# Patient Record
Sex: Male | Born: 1955
Health system: Southern US, Community
[De-identification: ages and names within clinical notes are randomized; demographics above are authoritative.]

---

## 1996-11-26 HISTORY — PX: CHOLECYSTECTOMY: SHX55

## 2006-11-26 HISTORY — PX: HERNIA REPAIR: SHX51

## 2011-02-19 DIAGNOSIS — Z85828 Personal history of other malignant neoplasm of skin: Secondary | ICD-10-CM | POA: Insufficient documentation

## 2012-12-10 DIAGNOSIS — H903 Sensorineural hearing loss, bilateral: Secondary | ICD-10-CM | POA: Insufficient documentation

## 2012-12-10 DIAGNOSIS — H908 Mixed conductive and sensorineural hearing loss, unspecified: Secondary | ICD-10-CM | POA: Insufficient documentation

## 2014-05-03 DIAGNOSIS — N529 Male erectile dysfunction, unspecified: Secondary | ICD-10-CM | POA: Insufficient documentation

## 2015-06-02 DIAGNOSIS — R001 Bradycardia, unspecified: Secondary | ICD-10-CM | POA: Insufficient documentation

## 2016-06-25 ENCOUNTER — Encounter: Payer: Self-pay | Admitting: Family Medicine

## 2017-05-02 LAB — HEPATIC FUNCTION PANEL: Bilirubin, Total: 0.5

## 2017-05-02 LAB — BASIC METABOLIC PANEL
BUN: 14 (ref 4–21)
Creatinine: 1.2 (ref 0.6–1.3)
Glucose: 86
Sodium: 141 (ref 137–147)

## 2017-05-02 LAB — PSA: PSA: 2.9

## 2017-05-02 LAB — TSH: TSH: 1.86 (ref <=5.90)

## 2019-07-13 ENCOUNTER — Ambulatory Visit (INDEPENDENT_AMBULATORY_CARE_PROVIDER_SITE_OTHER): Admitting: Family Medicine

## 2019-07-13 ENCOUNTER — Other Ambulatory Visit: Payer: Self-pay

## 2019-07-13 ENCOUNTER — Encounter: Payer: Self-pay | Admitting: Family Medicine

## 2019-07-13 VITALS — BP 148/84 | HR 67 | Temp 98.5°F | Ht 72.0 in | Wt 177.2 lb

## 2019-07-13 DIAGNOSIS — Z125 Encounter for screening for malignant neoplasm of prostate: Secondary | ICD-10-CM

## 2019-07-13 DIAGNOSIS — Z0000 Encounter for general adult medical examination without abnormal findings: Secondary | ICD-10-CM

## 2019-07-13 DIAGNOSIS — Z1159 Encounter for screening for other viral diseases: Secondary | ICD-10-CM

## 2019-07-13 DIAGNOSIS — I10 Essential (primary) hypertension: Secondary | ICD-10-CM | POA: Diagnosis not present

## 2019-07-13 DIAGNOSIS — Z114 Encounter for screening for human immunodeficiency virus [HIV]: Secondary | ICD-10-CM

## 2019-07-13 DIAGNOSIS — C44519 Basal cell carcinoma of skin of other part of trunk: Secondary | ICD-10-CM | POA: Insufficient documentation

## 2019-07-13 HISTORY — DX: Essential (primary) hypertension: I10

## 2019-07-13 LAB — LIPID PANEL
Cholesterol: 162 mg/dL (ref 0–200)
HDL: 36.5 mg/dL — ABNORMAL LOW (ref >39.00)
LDL Cholesterol: 86 mg/dL (ref 0–99)
NonHDL: 125.54
Total CHOL/HDL Ratio: 4
Triglycerides: 196 mg/dL — ABNORMAL HIGH (ref 0.0–149.0)
VLDL: 39.2 mg/dL (ref 0.0–40.0)

## 2019-07-13 LAB — COMPREHENSIVE METABOLIC PANEL
ALT: 18 U/L (ref 0–53)
AST: 16 U/L (ref 0–37)
Albumin: 4.5 g/dL (ref 3.5–5.2)
Alkaline Phosphatase: 82 U/L (ref 39–117)
BUN: 11 mg/dL (ref 6–23)
CO2: 30 mEq/L (ref 19–32)
Calcium: 9.6 mg/dL (ref 8.4–10.5)
Chloride: 103 mEq/L (ref 96–112)
Creatinine, Ser: 1.05 mg/dL (ref 0.40–1.50)
GFR: 71.06 mL/min (ref >60.00)
Glucose, Bld: 82 mg/dL (ref 70–99)
Potassium: 4.3 mEq/L (ref 3.5–5.1)
Sodium: 142 mEq/L (ref 135–145)
Total Bilirubin: 0.6 mg/dL (ref 0.2–1.2)
Total Protein: 6.8 g/dL (ref 6.0–8.3)

## 2019-07-13 LAB — CBC WITH DIFFERENTIAL/PLATELET
Basophils Absolute: 0 10*3/uL (ref 0.0–0.1)
Basophils Relative: 0.6 % (ref 0.0–3.0)
Eosinophils Absolute: 0.1 10*3/uL (ref 0.0–0.7)
Eosinophils Relative: 1.4 % (ref 0.0–5.0)
HCT: 45.2 % (ref 39.0–52.0)
Hemoglobin: 15.6 g/dL (ref 13.0–17.0)
Lymphocytes Relative: 29.7 % (ref 12.0–46.0)
Lymphs Abs: 1.7 10*3/uL (ref 0.7–4.0)
MCHC: 34.5 g/dL (ref 30.0–36.0)
MCV: 93.7 fl (ref 78.0–100.0)
Monocytes Absolute: 0.5 10*3/uL (ref 0.1–1.0)
Monocytes Relative: 8.3 % (ref 3.0–12.0)
Neutro Abs: 3.4 10*3/uL (ref 1.4–7.7)
Neutrophils Relative %: 60 % (ref 43.0–77.0)
Platelets: 210 10*3/uL (ref 150.0–400.0)
RBC: 4.82 Mil/uL (ref 4.22–5.81)
RDW: 12.6 % (ref 11.5–15.5)
WBC: 5.7 10*3/uL (ref 4.0–10.5)

## 2019-07-13 LAB — PSA: PSA: 3.94 ng/mL (ref 0.10–4.00)

## 2019-07-13 LAB — MICROALBUMIN / CREATININE URINE RATIO
Creatinine,U: 141.5 mg/dL
Microalb Creat Ratio: 0.6 mg/g (ref 0.0–30.0)
Microalb, Ur: 0.8 mg/dL (ref 0.0–1.9)

## 2019-07-13 LAB — TSH: TSH: 2.56 u[IU]/mL (ref 0.35–4.50)

## 2019-07-13 NOTE — Patient Instructions (Signed)
Goal blood pressure is 140/80 or less. I need to see if you running over this.     Preventive Care 27-64 Years Old, Male Preventive care refers to lifestyle choices and visits with your health care provider that can promote health and wellness. This includes:  A yearly physical exam. This is also called an annual well check.  Regular dental and eye exams.  Immunizations.  Screening for certain conditions.  Healthy lifestyle choices, such as eating a healthy diet, getting regular exercise, not using drugs or products that contain nicotine and tobacco, and limiting alcohol use. What can I expect for my preventive care visit? Physical exam Your health care provider will check:  Height and weight. These may be used to calculate body mass index (BMI), which is a measurement that tells if you are at a healthy weight.  Heart rate and blood pressure.  Your skin for abnormal spots. Counseling Your health care provider may ask you questions about:  Alcohol, tobacco, and drug use.  Emotional well-being.  Home and relationship well-being.  Sexual activity.  Eating habits.  Work and work Statistician. What immunizations do I need?  Influenza (flu) vaccine  This is recommended every year. Tetanus, diphtheria, and pertussis (Tdap) vaccine  You may need a Td booster every 10 years. Varicella (chickenpox) vaccine  You may need this vaccine if you have not already been vaccinated. Zoster (shingles) vaccine  You may need this after age 20. Measles, mumps, and rubella (MMR) vaccine  You may need at least one dose of MMR if you were born in 1957 or later. You may also need a second dose. Pneumococcal conjugate (PCV13) vaccine  You may need this if you have certain conditions and were not previously vaccinated. Pneumococcal polysaccharide (PPSV23) vaccine  You may need one or two doses if you smoke cigarettes or if you have certain conditions. Meningococcal conjugate (MenACWY)  vaccine  You may need this if you have certain conditions. Hepatitis A vaccine  You may need this if you have certain conditions or if you travel or work in places where you may be exposed to hepatitis A. Hepatitis B vaccine  You may need this if you have certain conditions or if you travel or work in places where you may be exposed to hepatitis B. Haemophilus influenzae type b (Hib) vaccine  You may need this if you have certain risk factors. Human papillomavirus (HPV) vaccine  If recommended by your health care provider, you may need three doses over 6 months. You may receive vaccines as individual doses or as more than one vaccine together in one shot (combination vaccines). Talk with your health care provider about the risks and benefits of combination vaccines. What tests do I need? Blood tests  Lipid and cholesterol levels. These may be checked every 5 years, or more frequently if you are over 15 years old.  Hepatitis C test.  Hepatitis B test. Screening  Lung cancer screening. You may have this screening every year starting at age 82 if you have a 30-pack-year history of smoking and currently smoke or have quit within the past 15 years.  Prostate cancer screening. Recommendations will vary depending on your family history and other risks.  Colorectal cancer screening. All adults should have this screening starting at age 70 and continuing until age 68. Your health care provider may recommend screening at age 86 if you are at increased risk. You will have tests every 1-10 years, depending on your results and the type  of screening test.  Diabetes screening. This is done by checking your blood sugar (glucose) after you have not eaten for a while (fasting). You may have this done every 1-3 years.  Sexually transmitted disease (STD) testing. Follow these instructions at home: Eating and drinking  Eat a diet that includes fresh fruits and vegetables, whole grains, lean protein,  and low-fat dairy products.  Take vitamin and mineral supplements as recommended by your health care provider.  Do not drink alcohol if your health care provider tells you not to drink.  If you drink alcohol: ? Limit how much you have to 0-2 drinks a day. ? Be aware of how much alcohol is in your drink. In the [place] one drink equals one 12 oz bottle of beer (355 mL), one 5 oz glass of wine (148 mL), or one 1 oz glass of hard liquor (44 mL). Lifestyle  Take daily care of your teeth and gums.  Stay active. Exercise for at least 30 minutes on 5 or more days each week.  Do not use any products that contain nicotine or tobacco, such as cigarettes, e-cigarettes, and chewing tobacco. If you need help quitting, ask your health care provider.  If you are sexually active, practice safe sex. Use a condom or other form of protection to prevent STIs (sexually transmitted infections).  Talk with your health care provider about taking a low-dose aspirin every day starting at age 66. What's next?  Go to your health care provider once a year for a well check visit.  Ask your health care provider how often you should have your eyes and teeth checked.  Stay up to date on all vaccines. This information is not intended to replace advice given to you by your health care provider. Make sure you discuss any questions you have with your health care provider. Document Released: 12/09/2015 Document Revised: 11/06/2018 Document Reviewed: 11/06/2018 Elsevier Patient Education  2020 [employer].

## 2019-07-13 NOTE — Progress Notes (Signed)
Patient: Barry Tanner MRN: [id number] DOB: [birth year] PCP: Orma Flaming, MD     Subjective:  Chief Complaint  Patient presents with  . Establish Care    HPI: The patient is a 63 y.o. male who presents today for annual exam. He denies any changes to past medical history. There have been no recent hospitalizations. They are following a well balanced diet and mild exercise plan. Weight has been stable. No complaints today.   Family hx of diabetes/cva in mother. Paternal hx of aneurysm and possible cancer.   Hypertension: Here for follow up of hypertension.  Currently on lisinopril/hctz . Home readings range from 875-643 PIRJJOAC/16 diastolic. Takes medication as prescribed and denies any side effects. Exercise includes walking . Weight has been stable. Denies any chest pain, headaches, shortness of breath, vision changes, swelling in lower extremities.    There is no immunization history on file for this patient.   Colonoscopy: less than 2 years ago. Repeat in 10 years.  PSA: today  Tdap: 2016  Review of Systems  Constitutional: Negative for chills, fatigue and fever.  HENT: Positive for sore throat. Negative for congestion, ear pain, postnasal drip, rhinorrhea, sinus pressure and sinus pain.        Feels that this is attributed to not being hydrated enough  Eyes: Negative for visual disturbance.  Respiratory: Negative for cough and shortness of breath.   Cardiovascular: Negative.   Gastrointestinal: Negative for abdominal pain, diarrhea, nausea and vomiting.  Endocrine: Negative for polydipsia and polyuria.  Genitourinary: Negative.   Musculoskeletal: Negative.   Skin: Negative.  Negative for rash.  Neurological: Negative.   Psychiatric/Behavioral: Negative for sleep disturbance. The patient is not nervous/anxious.     Allergies Patient has No Known Allergies.  Past Medical History Patient  has no past medical history on file.  Surgical History Patient  has a past  surgical history that includes Hernia repair (2008) and Cholecystectomy (1998).  Family History Pateint's family history is not on file.  Social History Patient  reports that he has quit smoking. He has quit using smokeless tobacco. He reports current alcohol use. He reports that he does not use drugs.    Objective: Vitals:   07/13/19 0817 07/13/19 0848  BP: 138/90 (!) 148/84  Pulse: 67   Temp: 98.5 F (36.9 C)   TempSrc: Temporal   SpO2: 99%   Weight: 177 lb 3.2 oz (80.4 kg)   Height: 6' (1.829 m)     Body mass index is 24.03 kg/m.  Physical Exam Vitals signs reviewed.  Constitutional:      Appearance: Normal appearance. He is well-developed and normal weight.  HENT:     Head: Normocephalic and atraumatic.     Right Ear: External ear normal. There is impacted cerumen.     Left Ear: External ear normal. There is impacted cerumen.     Nose: Nose normal.     Mouth/Throat:     Mouth: Mucous membranes are moist.  Eyes:     Extraocular Movements: Extraocular movements intact.     Conjunctiva/sclera: Conjunctivae normal.     Pupils: Pupils are equal, round, and reactive to light.  Neck:     Musculoskeletal: Normal range of motion and neck supple.     Thyroid: No thyromegaly.     Vascular: No carotid bruit.     Comments: No thyromegaly.  Cardiovascular:     Rate and Rhythm: Normal rate and regular rhythm.     Pulses: Normal pulses.  Heart sounds: Normal heart sounds. No murmur.  Pulmonary:     Effort: Pulmonary effort is normal.     Breath sounds: Normal breath sounds.  Abdominal:     General: Abdomen is flat. Bowel sounds are normal. There is no distension.     Palpations: Abdomen is soft.     Tenderness: There is no abdominal tenderness.  Lymphadenopathy:     Cervical: No cervical adenopathy.  Skin:    General: Skin is warm and dry.     Capillary Refill: Capillary refill takes less than 2 seconds.     Findings: No rash.  Neurological:     General: No focal  deficit present.     Mental Status: He is alert and oriented to person, place, and time.     Cranial Nerves: No cranial nerve deficit.     Coordination: Coordination normal.     Deep Tendon Reflexes: Reflexes normal.  Psychiatric:        Mood and Affect: Mood normal.        Behavior: Behavior normal.        Depression screen [facility] 2/9 07/13/2019  Decreased Interest 0  Down, Depressed, Hopeless 0  PHQ - 2 Score 0    Assessment/plan: 1. Benign essential HTN Blood pressure is close to goal. Continue current anti-hypertensive medications. Refills not given and routine lab work will be done today. Recommended routine exercise and healthy diet including DASH diet and mediterranean diet. Encouraged weight loss. F/u in 6-12 months. Continue with log. Discussed his home readings are to goal, but elevated here. Goal for him is <140/80. Risks of uncontrolled htn discussed.   - CBC with Differential/Platelet - Comprehensive metabolic panel - Lipid panel - Microalbumin / creatinine urine ratio  2. Annual physical exam utd on HM. Brought records except for immunizations. Requested records be sent over. Routine labs today. Increase exercise, but overall doing well. F/u in one year or as needed.  Patient counseling [x]    Nutrition: Stressed importance of moderation in sodium/caffeine intake, saturated fat and cholesterol, caloric balance, sufficient intake of fresh fruits, vegetables, fiber, calcium, iron, and 1 mg of folate supplement per day (for females capable of pregnancy).  [x]    Stressed the importance of regular exercise.   []    Substance Abuse: Discussed cessation/primary prevention of tobacco, alcohol, or other drug use; driving or other dangerous activities under the influence; availability of treatment for abuse.   [x]    Injury prevention: Discussed safety belts, safety helmets, smoke detector, smoking near bedding or upholstery.   [x]    Sexuality: Discussed sexually transmitted diseases,  partner selection, use of condoms, avoidance of unintended pregnancy  and contraceptive alternatives.  [x]    Dental health: Discussed importance of regular tooth brushing, flossing, and dental visits.  [x]    Health maintenance and immunizations reviewed. Please refer to Health maintenance section.    - TSH  3. Screening for prostate cancer  - PSA  4. Encounter for screening for HIV  - HIV Antibody (routine testing w rflx)  5. Encounter for hepatitis C screening test for low risk patient  - Hepatitis C antibody  Return in about 1 year (around 07/12/2020) for annual/htn .   Orma Flaming, MD [place]  07/13/2019

## 2019-07-15 ENCOUNTER — Encounter: Payer: Self-pay | Admitting: Family Medicine

## 2019-07-15 DIAGNOSIS — E785 Hyperlipidemia, unspecified: Secondary | ICD-10-CM | POA: Insufficient documentation

## 2019-07-15 DIAGNOSIS — E782 Mixed hyperlipidemia: Secondary | ICD-10-CM

## 2019-07-15 HISTORY — DX: Mixed hyperlipidemia: E78.2

## 2019-07-15 LAB — HEPATITIS C ANTIBODY
Hepatitis C Ab: NONREACTIVE
SIGNAL TO CUT-OFF: 0.01 (ref <1.00)

## 2019-07-15 LAB — HIV ANTIBODY (ROUTINE TESTING W REFLEX): HIV 1&2 Ab, 4th Generation: NONREACTIVE

## 2019-07-29 ENCOUNTER — Other Ambulatory Visit: Payer: Self-pay | Admitting: Family Medicine

## 2019-07-29 DIAGNOSIS — E782 Mixed hyperlipidemia: Secondary | ICD-10-CM

## 2019-07-29 MED ORDER — ROSUVASTATIN CALCIUM 5 MG PO TABS: 5.0000 mg | ORAL_TABLET | Freq: Every day | ORAL | 3 refills | Status: DC

## 2019-08-06 ENCOUNTER — Encounter: Payer: Self-pay | Admitting: Family Medicine

## 2019-08-07 ENCOUNTER — Encounter: Payer: Self-pay | Admitting: Family Medicine

## 2019-08-07 DIAGNOSIS — H53001 Unspecified amblyopia, right eye: Secondary | ICD-10-CM | POA: Insufficient documentation

## 2019-11-18 ENCOUNTER — Encounter: Payer: Self-pay | Admitting: Family Medicine

## 2020-01-20 ENCOUNTER — Encounter: Payer: Self-pay | Admitting: Family Medicine

## 2020-01-21 ENCOUNTER — Other Ambulatory Visit: Payer: Self-pay

## 2020-01-21 ENCOUNTER — Other Ambulatory Visit: Payer: Self-pay | Admitting: Family Medicine

## 2020-01-21 MED ORDER — LISINOPRIL 10 MG PO TABS: 10.0000 mg | ORAL_TABLET | Freq: Two times a day (BID) | ORAL | 0 refills | Status: DC

## 2020-01-21 NOTE — Progress Notes (Signed)
Rx refill for Lisinopril, sent to pharmacy.

## 2020-02-17 ENCOUNTER — Encounter: Payer: Self-pay | Admitting: Family Medicine

## 2020-04-12 ENCOUNTER — Telehealth: Payer: Self-pay

## 2020-04-12 NOTE — Telephone Encounter (Signed)
OK for audiologist referral?

## 2020-04-12 NOTE — Telephone Encounter (Signed)
Patient states that he need hearing aids. Patient would like to have a hearing test completed. Patient states that he had a hearing test done over 2 years ago. Patient would like to know would Dr. Rogers Blocker could complete this test or could she referral him over to a specialist.

## 2020-04-13 ENCOUNTER — Other Ambulatory Visit: Payer: Self-pay | Admitting: Family Medicine

## 2020-04-13 DIAGNOSIS — H9113 Presbycusis, bilateral: Secondary | ICD-10-CM

## 2020-04-13 NOTE — Telephone Encounter (Signed)
Pt notified, and he verbalizes understanding.

## 2020-04-13 NOTE — Telephone Encounter (Signed)
Please let him know he will have to go see an audiologist. I put in referral for this for him. Let us know if he doesn't hear from the referral department!  Dr. Rogers Blocker

## 2020-04-19 ENCOUNTER — Encounter: Payer: Self-pay | Admitting: Family Medicine

## 2020-05-02 ENCOUNTER — Other Ambulatory Visit: Payer: Self-pay

## 2020-05-02 ENCOUNTER — Encounter: Payer: Self-pay | Admitting: Family Medicine

## 2020-05-02 MED ORDER — HYDROCHLOROTHIAZIDE 12.5 MG PO CAPS: 12.5000 mg | ORAL_CAPSULE | Freq: Every day | ORAL | 0 refills | Status: DC

## 2020-05-04 ENCOUNTER — Encounter: Payer: Self-pay | Admitting: Family Medicine

## 2020-05-04 ENCOUNTER — Other Ambulatory Visit: Payer: Self-pay

## 2020-05-04 DIAGNOSIS — H9113 Presbycusis, bilateral: Secondary | ICD-10-CM

## 2020-05-04 NOTE — Progress Notes (Signed)
NT

## 2020-05-17 ENCOUNTER — Encounter: Payer: Self-pay | Admitting: Family Medicine

## 2020-05-23 ENCOUNTER — Telehealth: Payer: Self-pay | Admitting: Family Medicine

## 2020-05-23 NOTE — Telephone Encounter (Signed)
Dr. Pollie Friar office called stating that they are not in network with [facility].  They are requesting patient to be referred to a different office.

## 2020-05-24 ENCOUNTER — Telehealth (INDEPENDENT_AMBULATORY_CARE_PROVIDER_SITE_OTHER): Payer: Medicare Other | Admitting: Family Medicine

## 2020-05-24 ENCOUNTER — Encounter: Payer: Self-pay | Admitting: Family Medicine

## 2020-05-24 DIAGNOSIS — R21 Rash and other nonspecific skin eruption: Secondary | ICD-10-CM | POA: Diagnosis not present

## 2020-05-24 MED ORDER — TRIAMCINOLONE ACETONIDE 0.1 % EX CREA: 1.0000 "application " | TOPICAL_CREAM | Freq: Two times a day (BID) | CUTANEOUS | 0 refills | Status: DC

## 2020-05-24 NOTE — Progress Notes (Signed)
Virtual Visit via Video Note  I connected with Barry Tanner  on 05/24/20 at 12:00 PM EDT by a video enabled telemedicine application and verified that I am speaking with the correct person using two identifiers.  Location patient: home, [place] Location provider:work or home office Persons participating in the virtual visit: patient, provider  I discussed the limitations of evaluation and management by telemedicine and the availability of in person appointments. The patient expressed understanding and agreed to proceed.   HPI:  Acute visit for Rash: -started about 4 days ago -symptoms: itchy rash on the R arm and a little on the L wrist, L ankle, very itchy, vesicular -reports has been outside quite a bit, has dog that goes in and out, has been doing some yard work -denies lesion on or around mucus membranes, breathing issue, purulent discharge, malaise -BP 110/76, HR 65, temp 97.8  ROS: See pertinent positives and negatives per HPI.  History reviewed. No pertinent past medical history.  Past Surgical History:  Procedure Laterality Date  . CHOLECYSTECTOMY  1998  . HERNIA REPAIR  2008    Family History  Problem Relation Age of Onset  . Stroke Mother   . Hypertension Mother   . Diabetes Mother   . Hearing loss Father     SOCIAL HX: see hpi   Current Outpatient Medications:  .  Ascorbic Acid (VITAMIN C) 1000 MG tablet, Take 1,000 mg by mouth daily., Disp: , Rfl:  .  hydrochlorothiazide (MICROZIDE) 12.5 MG capsule, Take 1 capsule (12.5 mg total) by mouth daily., Disp: 90 capsule, Rfl: 0 .  lisinopril (ZESTRIL) 10 MG tablet, TAKE 1 TABLET(10 MG) BY MOUTH TWICE DAILY, Disp: 180 tablet, Rfl: 1 .  Multiple Vitamin (MULTIVITAMIN ADULT PO), Take by mouth daily., Disp: , Rfl:  .  Omega-3 Fatty Acids (FISH OIL) 1000 MG CAPS, Take 2,000 mg by mouth 2 (two) times daily., Disp: , Rfl:  .  rosuvastatin (CRESTOR) 5 MG tablet, Take 1 tablet (5 mg total) by mouth daily., Disp: 90 tablet, Rfl: 3 .   triamcinolone cream (KENALOG) 0.1 %, Apply 1 application topically 2 (two) times daily., Disp: 45 g, Rfl: 0  EXAM:  VITALS per patient if applicable:  GENERAL: alert, oriented, appears well and in no acute distress  HEENT: atraumatic, conjunttiva clear, no obvious abnormalities on inspection of external nose and ears  NECK: normal movements of the head and neck  LUNGS: on inspection no signs of respiratory distress, breathing rate appears normal, no obvious gross SOB, gasping or wheezing  CV: no obvious cyanosis  SKIN: on video visit exam scattered plaques of erythematous papulovesicular rash on the R forearm and arm, small similar patch on the L wrist and L ankle  MS: moves all visible extremities without noticeable abnormality  PSYCH/NEURO: pleasant and cooperative, no obvious depression or anxiety, speech and thought processing grossly intact  ASSESSMENT AND PLAN:  Discussed the following assessment and plan:  Rash  -we discussed possible serious and likely etiologies, options for evaluation and workup, limitations of telemedicine visit vs in person visit, treatment, treatment risks and precautions. Pt prefers to treat via telemedicine empirically rather then risking or undertaking an in person visit at this moment. Suspect toxicodendron dermatitis vs other. Opted for topical triam cr 0.1% bid x 2 weeks, caution in creases, antihistamine OTC, gentle skin care and patient agrees to seek prompt in person care if worsening, new symptoms arise, spreading or lesions on the face or if is not improving with treatment.  I discussed the assessment and treatment plan with the patient. The patient was provided an opportunity to ask questions and all were answered. The patient agreed with the plan and demonstrated an understanding of the instructions.   The patient was advised to call back or seek an in-person evaluation if the symptoms worsen or if the condition fails to improve as  anticipated.   Lucretia Kern, DO

## 2020-05-25 ENCOUNTER — Telehealth: Admitting: Physician Assistant

## 2020-07-11 ENCOUNTER — Encounter: Payer: Self-pay | Admitting: Family Medicine

## 2020-07-11 NOTE — Telephone Encounter (Signed)
Please r/s CPE for Virtual Visit.   Thank You

## 2020-07-11 NOTE — Telephone Encounter (Signed)
Patient returned melitta's call, states he went and got tested and should receive results in 3-5 business days and has rescheduled his CPE.

## 2020-07-12 ENCOUNTER — Other Ambulatory Visit: Payer: Self-pay

## 2020-07-12 ENCOUNTER — Other Ambulatory Visit: Payer: Medicare Other

## 2020-07-12 DIAGNOSIS — Z20822 Contact with and (suspected) exposure to covid-19: Secondary | ICD-10-CM

## 2020-07-13 ENCOUNTER — Encounter: Payer: TRICARE For Life (TFL) | Admitting: Family Medicine

## 2020-07-13 LAB — SARS-COV-2, NAA 2 DAY TAT

## 2020-07-13 LAB — NOVEL CORONAVIRUS, NAA: SARS-CoV-2, NAA: NOT DETECTED

## 2020-07-15 ENCOUNTER — Other Ambulatory Visit: Payer: Self-pay | Admitting: Family Medicine

## 2020-07-20 ENCOUNTER — Other Ambulatory Visit: Payer: Self-pay

## 2020-07-20 ENCOUNTER — Encounter: Payer: Self-pay | Admitting: Family Medicine

## 2020-07-20 ENCOUNTER — Ambulatory Visit (INDEPENDENT_AMBULATORY_CARE_PROVIDER_SITE_OTHER): Payer: Medicare Other | Admitting: Family Medicine

## 2020-07-20 VITALS — BP 110/70 | HR 66 | Temp 98.7°F | Ht 72.0 in | Wt 177.8 lb

## 2020-07-20 DIAGNOSIS — I10 Essential (primary) hypertension: Secondary | ICD-10-CM

## 2020-07-20 DIAGNOSIS — L570 Actinic keratosis: Secondary | ICD-10-CM

## 2020-07-20 DIAGNOSIS — Z23 Encounter for immunization: Secondary | ICD-10-CM

## 2020-07-20 DIAGNOSIS — Z136 Encounter for screening for cardiovascular disorders: Secondary | ICD-10-CM

## 2020-07-20 DIAGNOSIS — E782 Mixed hyperlipidemia: Secondary | ICD-10-CM | POA: Diagnosis not present

## 2020-07-20 DIAGNOSIS — F17211 Nicotine dependence, cigarettes, in remission: Secondary | ICD-10-CM

## 2020-07-20 DIAGNOSIS — Z125 Encounter for screening for malignant neoplasm of prostate: Secondary | ICD-10-CM | POA: Diagnosis not present

## 2020-07-20 DIAGNOSIS — F17210 Nicotine dependence, cigarettes, uncomplicated: Secondary | ICD-10-CM

## 2020-07-20 DIAGNOSIS — R972 Elevated prostate specific antigen [PSA]: Secondary | ICD-10-CM

## 2020-07-20 DIAGNOSIS — Z0000 Encounter for general adult medical examination without abnormal findings: Secondary | ICD-10-CM

## 2020-07-20 DIAGNOSIS — Z720 Tobacco use: Secondary | ICD-10-CM

## 2020-07-20 LAB — PSA: PSA: 2 ng/mL (ref <=4.0)

## 2020-07-20 MED ORDER — HYDROCHLOROTHIAZIDE 12.5 MG PO CAPS
12.5000 mg | ORAL_CAPSULE | Freq: Every day | ORAL | 0 refills | Status: DC
Start: 2020-07-20 — End: 2020-10-18

## 2020-07-20 MED ORDER — LISINOPRIL 10 MG PO TABS
ORAL_TABLET | ORAL | 1 refills | Status: DC
Start: 2020-07-20 — End: 2021-04-14

## 2020-07-20 MED ORDER — ROSUVASTATIN CALCIUM 5 MG PO TABS
ORAL_TABLET | ORAL | 3 refills | Status: DC
Start: 2020-07-20 — End: 2021-07-25

## 2020-07-20 NOTE — Progress Notes (Signed)
Phone: [phone] Subjective:  Patient presents today for their Welcome to Medicare Exam  And follow up of htn/hyperlipidemia.   Hypertension:  Here for follow up of hypertension.  Currently on lisinopril 10mg . Takes medication as prescribed and denies any side effects. Exercise includes . Weight has been stable. Denies any chest pain, headaches, shortness of breath, vision changes, swelling in lower extremities.   Hyperlipidemia Currently on crestor 5mg  nightly. I started him on this last fall in 2020. Family hx of CVA/diabetes in his mother. No CAD in family. Past history of smoking.   Past history of smoking. Never had AAA screening. Stopped over 40 years ago. Does not qualify for low dose CT scan.   Preventive Screening-Counseling & Management  Vision screen:   Hearing Screening   125Hz  250Hz  500Hz  1000Hz  2000Hz  3000Hz  4000Hz  6000Hz  8000Hz   Right ear:           Left ear:             Visual Acuity Screening   Right eye Left eye Both eyes  Without correction:     With correction: 20/60 20/20 20/20   Comments: Vision screening was done with glasses.   Advanced directives: yes  Smoking Status: past smoker. Current chewing tobacco.  Second Hand Smoking status: No smokers in home  Risk Factors Regular exercise: no routine, but does walk dog daily about 1 mile.  Diet: well balanced.  Fall Risk: None  Fall Risk  07/20/2020  Falls in the past year? 0  Risk for fall due to : No Fall Risks   Opioid use history:  no long term opioids use  Cardiac risk factors:  advanced age (older than 69 for men, 70 for women) yes Hyperlipidemia yes No diabetes.no  Family History: CVA in mom and diabetes.   Depression Screen None. PHQ2 0  Depression screen [facility] 2/9 07/20/2020 07/13/2019  Decreased Interest 0 0  Down, Depressed, Hopeless 0 0  PHQ - 2 Score 0 0    Activities of Daily Living Independent ADLs and IADLs   Hearing Difficulties: -has been tested by audiology 04/2020.  Presbycusis bilateral. Possible HA  Cognitive Testing No reported trouble.    Normal 3 word recall  Mini cog: 5/5   List the Names of Other Physician/Practitioners you currently use:   Immunization History  Administered Date(s) Administered  . PFIZER SARS-COV-2 Vaccination 02/19/2020, 03/11/2020  . Zoster 06/27/2015   Required Immunizations needed today strep pneumonia.   Screening tests- up to date Health Maintenance Due  Topic Date Due  . PNA vac Low Risk Adult (1 of 2 - PCV13) Never done  . INFLUENZA VACCINE  06/26/2020    Review of Systems  Constitutional: Negative for chills, fever and malaise/fatigue.  HENT: Negative for hearing loss and sore throat.   Eyes: Negative for blurred vision and double vision.  Respiratory: Negative for cough, shortness of breath and wheezing.   Cardiovascular: Negative for chest pain, palpitations and leg swelling.  Gastrointestinal: Negative for abdominal pain, blood in stool, nausea and vomiting.  Genitourinary: Negative for dysuria and hematuria.  Musculoskeletal: Negative for falls.  Skin: Negative for rash.  Neurological: Negative for dizziness and weakness.  Psychiatric/Behavioral: Negative for memory loss and suicidal ideas. The patient is not nervous/anxious and does not have insomnia.      The following were reviewed and entered/updated in epic: History reviewed. No pertinent past medical history. Patient Active Problem List   Diagnosis Date Noted  . Presbycusis, bilateral 05/04/2020  . Amblyopia,  right eye 08/07/2019  . Hyperlipidemia 07/15/2019  . Benign essential HTN 07/13/2019  . Basal cell carcinoma (BCC) of back 07/13/2019   Past Surgical History:  Procedure Laterality Date  . CHOLECYSTECTOMY  1998  . HERNIA REPAIR  2008    Family History  Problem Relation Age of Onset  . Stroke Mother   . Hypertension Mother   . Diabetes Mother   . Hearing loss Father     Medications- reviewed and  updated Current Outpatient Medications  Medication Sig Dispense Refill  . Ascorbic Acid (VITAMIN C) 1000 MG tablet Take 1,000 mg by mouth daily.    . hydrochlorothiazide (MICROZIDE) 12.5 MG capsule Take 1 capsule (12.5 mg total) by mouth daily. 90 capsule 0  . lisinopril (ZESTRIL) 10 MG tablet TAKE 1 TABLET(10 MG) BY MOUTH TWICE DAILY 180 tablet 1  . Multiple Vitamin (MULTIVITAMIN ADULT PO) Take by mouth daily.    . Omega-3 Fatty Acids (FISH OIL) 1000 MG CAPS Take 2,000 mg by mouth 2 (two) times daily.    . rosuvastatin (CRESTOR) 5 MG tablet TAKE 1 TABLET(5 MG) BY MOUTH DAILY 90 tablet 3   No current facility-administered medications for this visit.    Allergies-reviewed and updated No Known Allergies  Social History   Socioeconomic History  . Marital status: Divorced    Spouse name: Not on file  . Number of children: Not on file  . Years of education: Not on file  . Highest education level: Not on file  Occupational History  . Not on file  Tobacco Use  . Smoking status: Former Research scientist (life sciences)  . Smokeless tobacco: Former Network engineer  . Vaping Use: Never used  Substance and Sexual Activity  . Alcohol use: Yes  . Drug use: Never  . Sexual activity: Not on file  Other Topics Concern  . Not on file  Social History Narrative  . Not on file   Social Determinants of Health   Financial Resource Strain:   . Difficulty of Paying Living Expenses: Not on file  Food Insecurity:   . Worried About Charity fundraiser in the Last Year: Not on file  . Ran Out of Food in the Last Year: Not on file  Transportation Needs:   . Lack of Transportation (Medical): Not on file  . Lack of Transportation (Non-Medical): Not on file  Physical Activity:   . Days of Exercise per Week: Not on file  . Minutes of Exercise per Session: Not on file  Stress:   . Feeling of Stress : Not on file  Social Connections:   . Frequency of Communication with Friends and Family: Not on file  . Frequency of  Social Gatherings with Friends and Family: Not on file  . Attends Religious Services: Not on file  . Active Member of Clubs or Organizations: Not on file  . Attends Archivist Meetings: Not on file  . Marital Status: Not on file    Objective: BP 110/70   Pulse 66   Temp 98.7 F (37.1 C) (Temporal)   Ht 6' (1.829 m)   Wt 177 lb 12.8 oz (80.6 kg)   SpO2 99%   BMI 24.11 kg/m  Gen: NAD, resting comfortably HEENT: Mucous membranes are moist. Oropharynx normal. Left TM scarred/shruken.  Neck: no thyromegaly. No carotid bruits.  CV: RRR no murmurs rubs or gallops Lungs: CTAB no crackles, wheeze, rhonchi Abdomen: soft/nontender/nondistended/normal bowel sounds. No rebound or guarding.  Ext: no edema Skin: warm, dry.  Multiple AK on face/head.  Neuro: grossly normal, moves all extremities, PERRLA  Ekg: NSR. Rate of 56.   Assessment/Plan:  Welcome to Medicare exam completed- discussed recommended screenings anddocumented any personalized health advice and referrals for preventive counseling. See AVS as well which was given to patient.   Status of chronic or acute concerns  1. Welcome to Medicare preventive visit Giving strep pneumo, EKG, PSA and AAA screening. Otherwise UTD. Handout given of HM.  - EKG 12-Lead - [place] AORTA MEDICARE SCREENING; Future  2. Benign essential HTN Blood pressure is to goal. Continue current anti-hypertensive medications. Refills given and routine lab work will be done today. Recommended routine exercise and healthy diet including DASH diet and mediterranean diet. Encouraged weight loss. F/u in 6 months.   - CBC with Differential/Platelet; Future - Comprehensive metabolic panel; Future - Microalbumin / creatinine urine ratio; Future  3. Mixed hyperlipidemia Currently on crestor 5mg  that I started last year. Tolerating well with no side effects. Fasting labs today.  - Lipid panel; Future  4. Screening PSA (prostate specific antigen)  - PSA,  Medicare  5. Tobacco abuse AAA screening below.   6. Nicotine dependence, cigarettes, in remission   - [place] AORTA MEDICARE SCREENING; Future  7. Nicotine dependence, cigarettes, uncomplicated   - [place] A[place]A MEDICARE SCREENING; Future  8. Encounter for screening for cardiovascular disorders   - [place] AORTA MEDICARE SCREENING; Future  9. Need for vaccination for Strep pneumoniae  - Pneumococcal polysaccharide vaccine 23-valent greater than or equal to 2yo subcutaneous/IM  10. Actinic keratosis History of skin graft on ear and multiple AK. Sending to derm.   This visit occurred during the SARS-CoV-2 public health emergency.  Safety protocols were in place, including screening questions prior to the visit, additional usage of staff PPE, and extensive cleaning of exam room while observing appropriate contact time as indicated for disinfecting solutions.     Future Appointments  Date Time Provider [place]  01/20/2021  8:00 AM Orma Flaming, MD [facility]   Return in about 6 months (around 01/20/2021) for routine htn follow up .   Lab/Order associations: Need for vaccination for Strep pneumoniae - Plan: Pneumococcal polysaccharide vaccine 23-valent greater than or equal to 2yo subcutaneous/IM  Welcome to Medicare preventive visit - Plan: EKG 12-Lead, US AORTA MEDICARE SCREENING  Benign essential HTN - Plan: CBC with Differential/Platelet, Comprehensive metabolic panel, Microalbumin / creatinine urine ratio, Microalbumin / creatinine urine ratio, Comprehensive metabolic panel, CBC with Differential/Platelet  Mixed hyperlipidemia - Plan: Lipid panel, Lipid panel  Screening PSA (prostate specific antigen) - Plan: PSA, Medicare  Tobacco abuse  Nicotine dependence, cigarettes, in remission  - Plan: [place] AORTA MEDICARE SCREENING  Nicotine dependence, cigarettes, uncomplicated  - Plan: US AORTA MEDICARE SCREENING  Encounter for screening for cardiovascular disorders  - Plan: US  AORTA MEDICARE SCREENING  Actinic keratosis - Plan: Ambulatory referral to Dermatology  Meds ordered this encounter  Medications  . hydrochlorothiazide (MICROZIDE) 12.5 MG capsule    Sig: Take 1 capsule (12.5 mg total) by mouth daily.    Dispense:  90 capsule    Refill:  0  . lisinopril (ZESTRIL) 10 MG tablet    Sig: TAKE 1 TABLET(10 MG) BY MOUTH TWICE DAILY    Dispense:  180 tablet    Refill:  1  . rosuvastatin (CRESTOR) 5 MG tablet    Sig: TAKE 1 TABLET(5 MG) BY MOUTH DAILY    Dispense:  90 tablet    Refill:  3  Return precautions advised. Orma Flaming, MD

## 2020-07-20 NOTE — Patient Instructions (Addendum)
  Barry Tanner , Thank you for taking time to come for your Medicare Wellness Visit. I appreciate your ongoing commitment to your health goals. Please review the following plan we discussed and let me know if I can assist you in the future.   These are the goals we discussed: Goals   None     This is a list of the screening recommended for you and due dates:  Health Maintenance  Topic Date Due  . Pneumonia vaccines (1 of 2 - PCV13) Never done  . Flu Shot  06/26/2020  . Colon Cancer Screening  08/18/2020  . Tetanus Vaccine  04/26/2026  . COVID-19 Vaccine  Completed  .  Hepatitis C: One time screening is recommended by Center for Disease Control  (CDC) for  adults born from [birth year] through 1966.   Completed  . HIV Screening  Completed

## 2020-07-21 LAB — CBC WITH DIFFERENTIAL/PLATELET
Absolute Monocytes: 640 cells/uL (ref 200–950)
Basophils Absolute: 41 cells/uL (ref 0–200)
Basophils Relative: 0.5 %
Eosinophils Absolute: 332 cells/uL (ref 15–500)
Eosinophils Relative: 4.1 %
HCT: 46.4 % (ref 38.5–50.0)
Hemoglobin: 15.9 g/dL (ref 13.2–17.1)
Lymphs Abs: 1920 cells/uL (ref 850–3900)
MCH: 32 pg (ref 27.0–33.0)
MCHC: 34.3 g/dL (ref 32.0–36.0)
MCV: 93.4 fL (ref 80.0–100.0)
MPV: 10.6 fL (ref 7.5–12.5)
Monocytes Relative: 7.9 %
Neutro Abs: 5168 cells/uL (ref 1500–7800)
Neutrophils Relative %: 63.8 %
Platelets: 185 10*3/uL (ref 140–400)
RBC: 4.97 10*6/uL (ref 4.20–5.80)
RDW: 11.8 % (ref 11.0–15.0)
Total Lymphocyte: 23.7 %
WBC: 8.1 10*3/uL (ref 3.8–10.8)

## 2020-07-21 LAB — COMPREHENSIVE METABOLIC PANEL
AG Ratio: 2.2 (calc) (ref 1.0–2.5)
ALT: 17 U/L (ref 9–46)
AST: 18 U/L (ref 10–35)
Albumin: 4.4 g/dL (ref 3.6–5.1)
Alkaline phosphatase (APISO): 77 U/L (ref 35–144)
BUN: 15 mg/dL (ref 7–25)
CO2: 28 mmol/L (ref 20–32)
Calcium: 9.6 mg/dL (ref 8.6–10.3)
Chloride: 104 mmol/L (ref 98–110)
Creat: 1.17 mg/dL (ref 0.70–1.25)
Globulin: 2 g/dL (calc) (ref 1.9–3.7)
Glucose, Bld: 83 mg/dL (ref 65–99)
Potassium: 5 mmol/L (ref 3.5–5.3)
Sodium: 140 mmol/L (ref 135–146)
Total Bilirubin: 0.6 mg/dL (ref 0.2–1.2)
Total Protein: 6.4 g/dL (ref 6.1–8.1)

## 2020-07-21 LAB — MICROALBUMIN / CREATININE URINE RATIO
Creatinine, Urine: 163 mg/dL (ref 20–320)
Microalb Creat Ratio: 2 mcg/mg creat (ref <30)
Microalb, Ur: 0.4 mg/dL

## 2020-07-21 LAB — LIPID PANEL
Cholesterol: 106 mg/dL (ref <200)
HDL: 32 mg/dL — ABNORMAL LOW (ref >=40)
LDL Cholesterol (Calc): 54 mg/dL (calc)
Non-HDL Cholesterol (Calc): 74 mg/dL (calc) (ref <130)
Total CHOL/HDL Ratio: 3.3 (calc) (ref <5.0)
Triglycerides: 123 mg/dL (ref <150)

## 2020-07-29 ENCOUNTER — Encounter: Payer: Self-pay | Admitting: Family Medicine

## 2020-07-29 ENCOUNTER — Ambulatory Visit
Admission: RE | Admit: 2020-07-29 | Discharge: 2020-07-29 | Disposition: A | Discharge status: Home / Self Care | Payer: Medicare Other | Source: Ambulatory Visit | Attending: Family Medicine | Admitting: Family Medicine

## 2020-07-29 DIAGNOSIS — Z0000 Encounter for general adult medical examination without abnormal findings: Secondary | ICD-10-CM

## 2020-07-29 DIAGNOSIS — F17211 Nicotine dependence, cigarettes, in remission: Secondary | ICD-10-CM

## 2020-07-29 DIAGNOSIS — F17210 Nicotine dependence, cigarettes, uncomplicated: Secondary | ICD-10-CM

## 2020-07-29 DIAGNOSIS — Z136 Encounter for screening for cardiovascular disorders: Secondary | ICD-10-CM

## 2020-10-16 ENCOUNTER — Other Ambulatory Visit: Payer: Self-pay | Admitting: Family Medicine

## 2020-10-30 ENCOUNTER — Other Ambulatory Visit: Payer: Self-pay | Admitting: Family Medicine

## 2020-10-31 ENCOUNTER — Encounter: Payer: Self-pay | Admitting: Family Medicine

## 2021-01-20 ENCOUNTER — Other Ambulatory Visit: Payer: Self-pay

## 2021-01-20 ENCOUNTER — Encounter: Payer: Self-pay | Admitting: Family Medicine

## 2021-01-20 ENCOUNTER — Ambulatory Visit (INDEPENDENT_AMBULATORY_CARE_PROVIDER_SITE_OTHER): Payer: Medicare Other | Admitting: Family Medicine

## 2021-01-20 VITALS — BP 122/70 | HR 64 | Temp 98.1°F | Ht 72.0 in | Wt 182.6 lb

## 2021-01-20 DIAGNOSIS — I10 Essential (primary) hypertension: Secondary | ICD-10-CM | POA: Diagnosis not present

## 2021-01-20 LAB — COMPREHENSIVE METABOLIC PANEL
ALT: 22 U/L (ref 0–53)
AST: 23 U/L (ref 0–37)
Albumin: 4.5 g/dL (ref 3.5–5.2)
Alkaline Phosphatase: 80 U/L (ref 39–117)
BUN: 17 mg/dL (ref 6–23)
CO2: 32 mEq/L (ref 19–32)
Calcium: 9.7 mg/dL (ref 8.4–10.5)
Chloride: 102 mEq/L (ref 96–112)
Creatinine, Ser: 1.17 mg/dL (ref 0.40–1.50)
GFR: 65.32 mL/min (ref >60.00)
Glucose, Bld: 85 mg/dL (ref 70–99)
Potassium: 4.2 mEq/L (ref 3.5–5.1)
Sodium: 141 mEq/L (ref 135–145)
Total Bilirubin: 0.8 mg/dL (ref 0.2–1.2)
Total Protein: 7.4 g/dL (ref 6.0–8.3)

## 2021-01-20 LAB — CBC WITH DIFFERENTIAL/PLATELET
Basophils Absolute: 0 10*3/uL (ref 0.0–0.1)
Basophils Relative: 0.5 % (ref 0.0–3.0)
Eosinophils Absolute: 0.2 10*3/uL (ref 0.0–0.7)
Eosinophils Relative: 2.2 % (ref 0.0–5.0)
HCT: 48.7 % (ref 39.0–52.0)
Hemoglobin: 16.7 g/dL (ref 13.0–17.0)
Lymphocytes Relative: 31 % (ref 12.0–46.0)
Lymphs Abs: 2.3 10*3/uL (ref 0.7–4.0)
MCHC: 34.3 g/dL (ref 30.0–36.0)
MCV: 91.4 fl (ref 78.0–100.0)
Monocytes Absolute: 0.5 10*3/uL (ref 0.1–1.0)
Monocytes Relative: 6.9 % (ref 3.0–12.0)
Neutro Abs: 4.4 10*3/uL (ref 1.4–7.7)
Neutrophils Relative %: 59.4 % (ref 43.0–77.0)
Platelets: 196 10*3/uL (ref 150.0–400.0)
RBC: 5.33 Mil/uL (ref 4.22–5.81)
RDW: 12.9 % (ref 11.5–15.5)
WBC: 7.5 10*3/uL (ref 4.0–10.5)

## 2021-01-20 MED ORDER — SILDENAFIL CITRATE 100 MG PO TABS
ORAL_TABLET | ORAL | 2 refills | Status: DC
Start: 2021-01-20 — End: 2023-08-14

## 2021-01-20 NOTE — Progress Notes (Signed)
Patient: Barry Tanner MRN: [id number] DOB: Mar 10, 1956 PCP: Orma Flaming, MD     Subjective:  Chief Complaint  Patient presents with  . Hypertension    HPI: The patient is a 65 y.o. male who presents today for HTN.  Hypertension: Here for follow up of hypertension.  Currently on lisinopril 10mg /daily and hctz 12.5mg /daily . Home readings range from 749 SWHQPRFF/63 diastolic. Takes medication as prescribed and denies any side effects. Exercise includes none right now, will start exercising once it's warmer. Weight has been stable. Denies any chest pain, headaches, shortness of breath, vision changes, swelling in lower extremities.   Lipids utd and will need repeated next visit.    Review of Systems  Constitutional: Negative for chills, fatigue and fever.  HENT: Negative for congestion, dental problem, ear pain, hearing loss and trouble swallowing.   Eyes: Negative for visual disturbance.  Respiratory: Negative for cough, chest tightness and shortness of breath.   Cardiovascular: Negative for chest pain, palpitations and leg swelling.  Gastrointestinal: Negative for abdominal pain, blood in stool, diarrhea and nausea.  Endocrine: Negative for cold intolerance, polydipsia, polyphagia and polyuria.  Genitourinary: Negative for dysuria and hematuria.  Musculoskeletal: Negative for arthralgias.  Skin: Negative for rash.  Neurological: Negative for dizziness and headaches.  Psychiatric/Behavioral: Negative for dysphoric mood and sleep disturbance. The patient is not nervous/anxious.     Allergies Patient has No Known Allergies.  Past Medical History Patient  has no past medical history on file.  Surgical History Patient  has a past surgical history that includes Hernia repair (2008) and Cholecystectomy (1998).  Family History Pateint's family history includes Diabetes in his mother; Hearing loss in his father; Hypertension in his mother; Stroke in his mother.  Social  History Patient  reports that he has quit smoking. He has quit using smokeless tobacco. He reports current alcohol use. He reports that he does not use drugs.    Objective: Vitals:   01/20/21 0756 01/20/21 0828  BP: 140/80 122/70  Pulse: 64   Temp: 98.1 F (36.7 C)   TempSrc: Temporal   SpO2: 96%   Weight: 182 lb 9.6 oz (82.8 kg)   Height: 6' (1.829 m)     Body mass index is 24.77 kg/m.  Physical Exam Vitals reviewed.  Constitutional:      Appearance: Normal appearance. He is well-developed, normal weight and well-nourished.  HENT:     Head: Normocephalic and atraumatic.     Right Ear: Tympanic membrane, ear canal and external ear normal.     Left Ear: Tympanic membrane, ear canal and external ear normal.     Mouth/Throat:     Mouth: Oropharynx is clear and moist.  Eyes:     Extraocular Movements: EOM normal.     Conjunctiva/sclera: Conjunctivae normal.     Pupils: Pupils are equal, round, and reactive to light.  Neck:     Thyroid: No thyromegaly.     Vascular: No carotid bruit.  Cardiovascular:     Rate and Rhythm: Normal rate and regular rhythm.     Pulses: Normal pulses and intact distal pulses.     Heart sounds: Normal heart sounds. No murmur heard.   Pulmonary:     Effort: Pulmonary effort is normal.     Breath sounds: Normal breath sounds.  Abdominal:     General: Abdomen is flat. Bowel sounds are normal. There is no distension.     Palpations: Abdomen is soft.     Tenderness: There is no  abdominal tenderness.  Musculoskeletal:     Cervical back: Normal range of motion and neck supple.  Lymphadenopathy:     Cervical: No cervical adenopathy.  Skin:    General: Skin is warm and dry.     Capillary Refill: Capillary refill takes less than 2 seconds.     Findings: No rash.  Neurological:     General: No focal deficit present.     Mental Status: He is alert and oriented to person, place, and time.     Cranial Nerves: No cranial nerve deficit.      Coordination: Coordination normal.     Deep Tendon Reflexes: Reflexes normal.  Psychiatric:        Mood and Affect: Mood and affect and mood normal.        Behavior: Behavior normal.    Depression screen [facility] 2/9 07/20/2020 07/13/2019  Decreased Interest 0 0  Down, Depressed, Hopeless 0 0  PHQ - 2 Score 0 0       Assessment/plan: 1. Benign essential HTN Blood pressure is to goal. Continue current anti-hypertensive medications: lisinopril 10mg /day and hctz 12.5mg /day.  Refills not given and routine lab work will be done today. Recommended routine exercise and healthy diet including DASH diet and mediterranean diet. Encouraged weight loss. F/u in 6 months with fasting labs..   - Comprehensive metabolic panel - CBC with Differential/Platelet    This visit occurred during the SARS-CoV-2 public health emergency.  Safety protocols were in place, including screening questions prior to the visit, additional usage of staff PPE, and extensive cleaning of exam room while observing appropriate contact time as indicated for disinfecting solutions.     Return in about 6 months (around 07/20/2021) for htn/chol. fasting labs! Orma Flaming, MD [place]   01/20/2021

## 2021-01-20 NOTE — Patient Instructions (Signed)
-  blood pressure is perfect. Keep up the good work! See you in 6 months with fasting lab work. (will check lipids next time).   Start some exercise when it gets warmer.  So good seeing you!  Dr. Rogers Blocker

## 2021-01-26 ENCOUNTER — Encounter: Payer: Self-pay | Admitting: Family Medicine

## 2021-01-26 ENCOUNTER — Other Ambulatory Visit: Payer: Self-pay

## 2021-01-26 MED ORDER — HYDROCHLOROTHIAZIDE 12.5 MG PO CAPS: ORAL_CAPSULE | ORAL | 0 refills | Status: DC

## 2021-03-01 IMAGING — US US ABDOMINAL AORTA SCREENING AAA
1 series · 7 of 7 positions shown · non-contrast
Comparison: None.

CLINICAL DATA: Male between 65-75 years of age with a smoking
history.

EXAM:
US ABDOMINAL AORTA MEDICARE SCREENING
TECHNIQUE: Ultrasound examination of the abdominal aorta was performed as a
screening evaluation for abdominal aortic aneurysm.

[Series 1: us abdominal aorta screening aaa · 0.23mm/px · 7 of 7 slices shown]
[im 1/7]
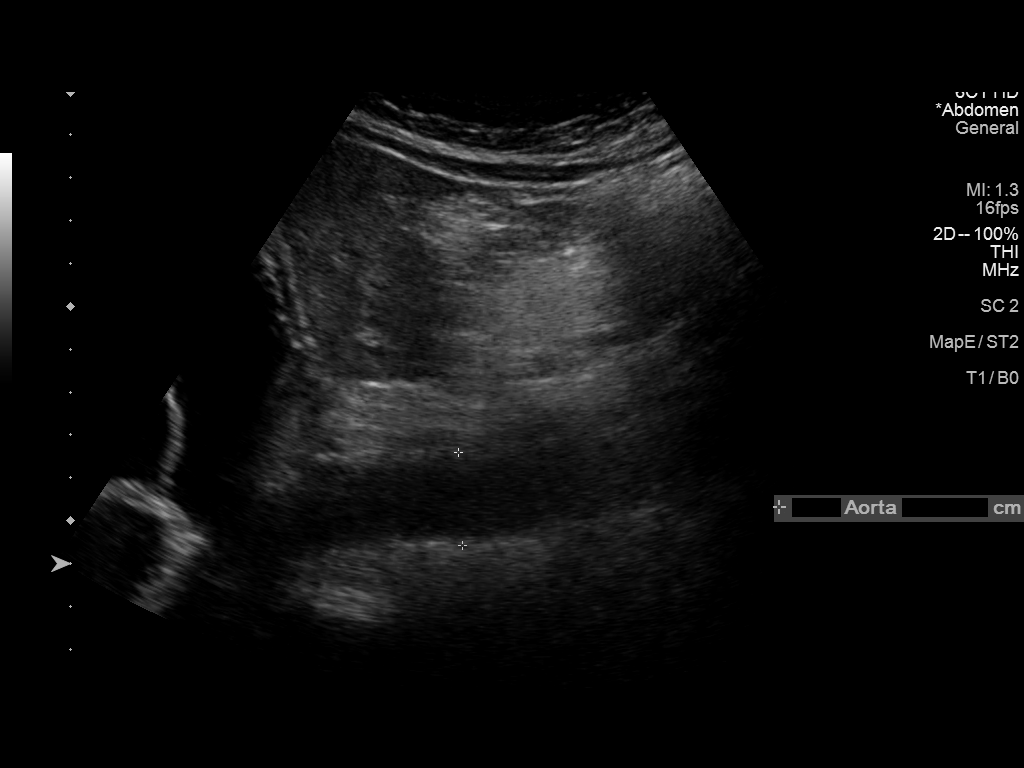
[im 2/7]
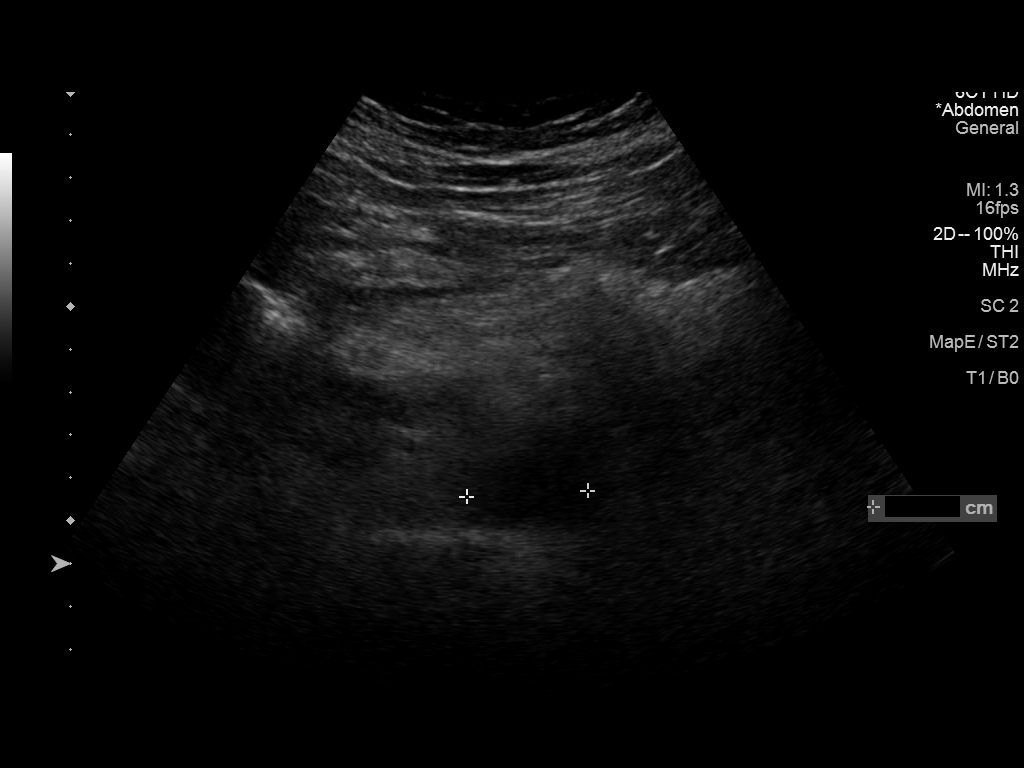
[im 3/7]
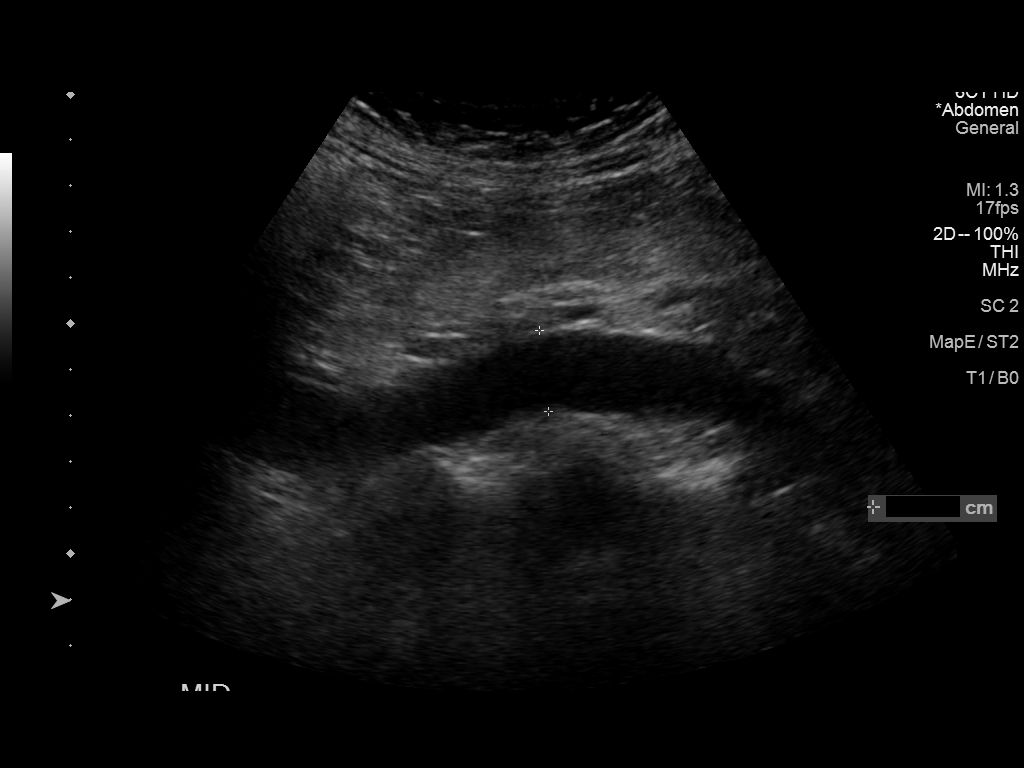
[im 4/7]
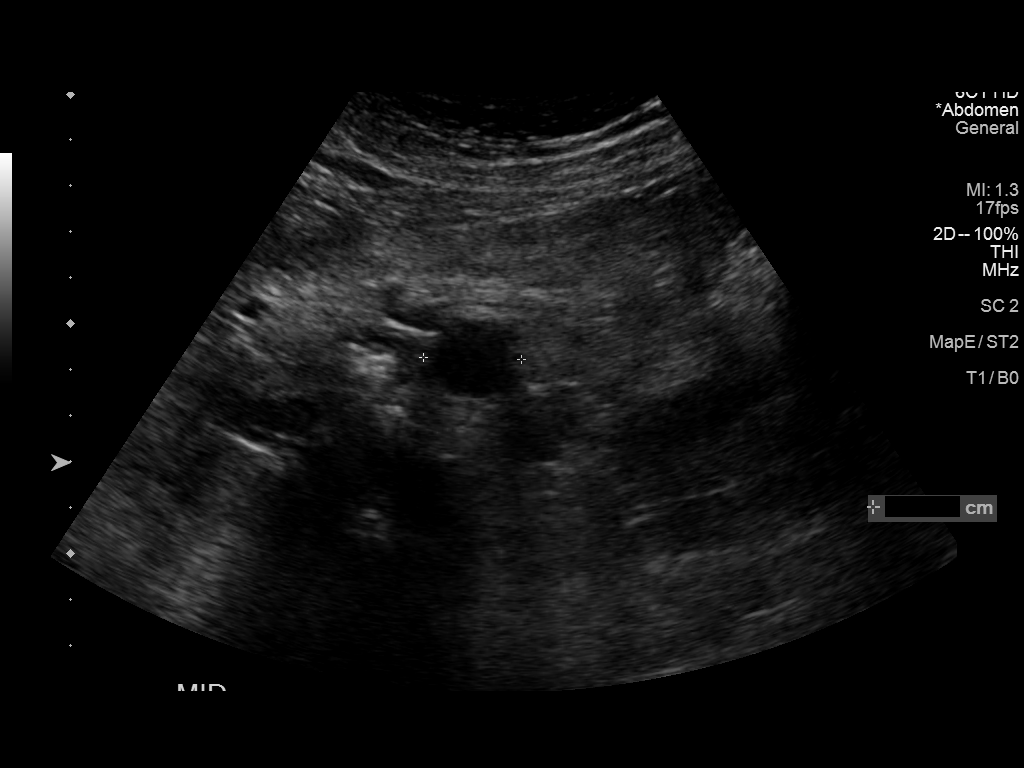
[im 5/7]
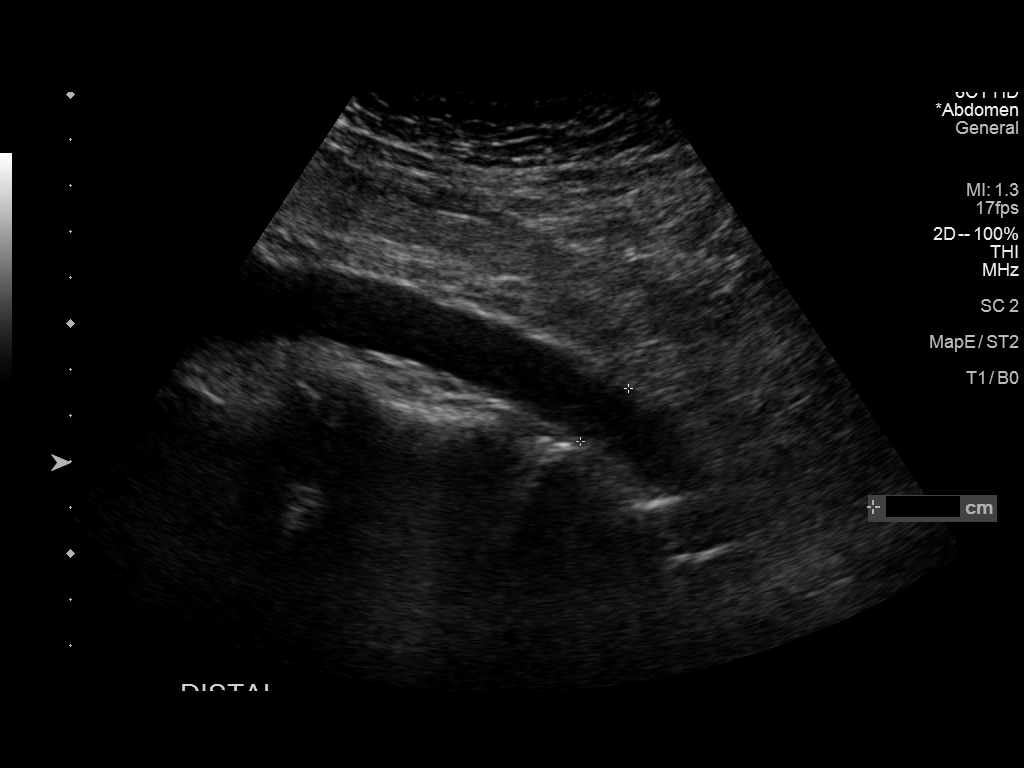
[im 6/7]
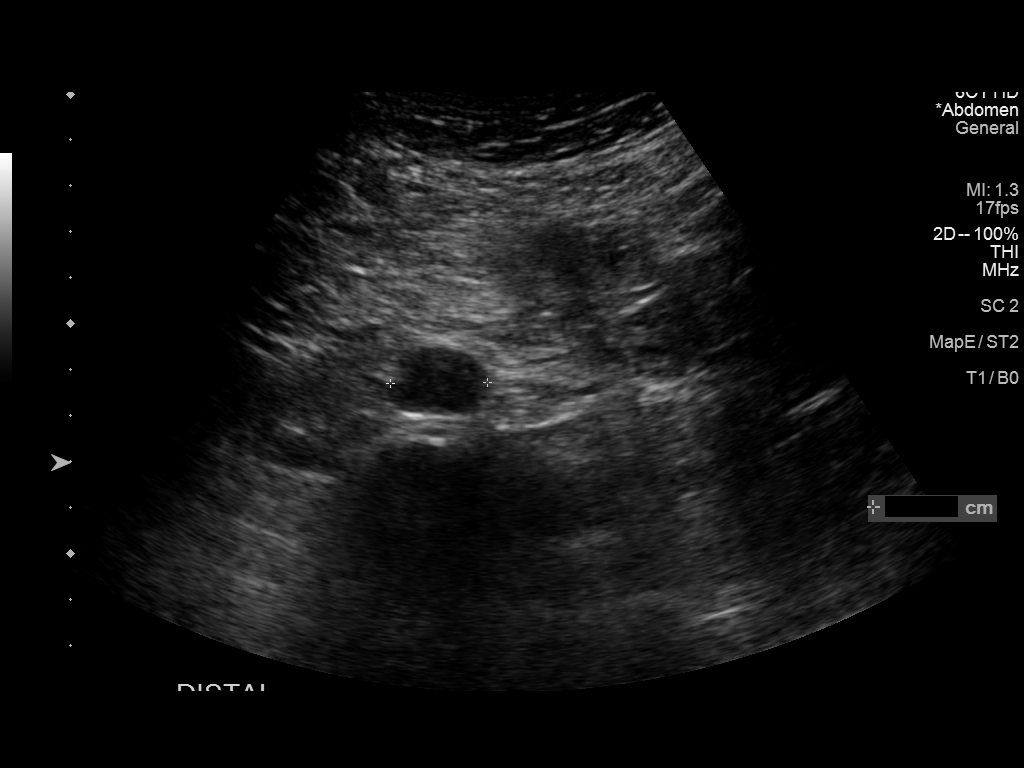
[im 7/7]
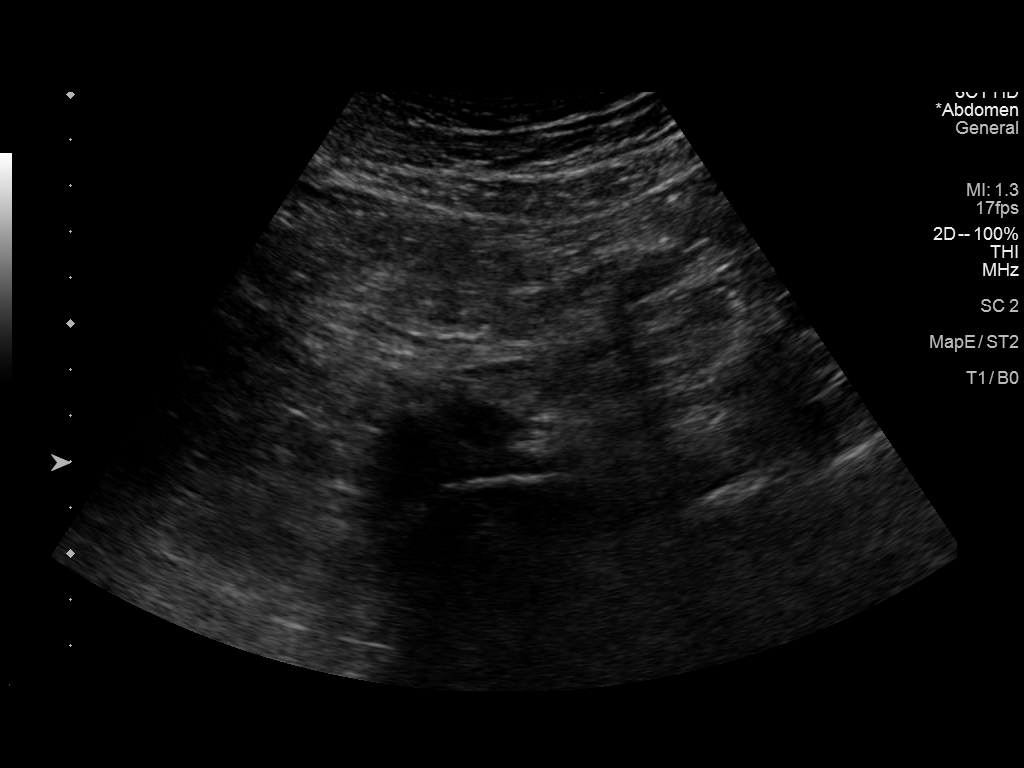

[7 of 7 positions shown; findings below may reference images not displayed]

FINDINGS: Abdominal aortic measurements as follows:

Proximal:  2.2 cm

Mid:  1.8 cm

Distal:  1.6 cm
IMPRESSION: No evidence of abdominal aortic aneurysm.

## 2021-04-14 ENCOUNTER — Other Ambulatory Visit: Payer: Self-pay | Admitting: Family Medicine

## 2021-04-20 ENCOUNTER — Telehealth: Payer: Self-pay

## 2021-04-20 MED ORDER — LISINOPRIL 10 MG PO TABS: ORAL_TABLET | ORAL | 3 refills | Status: DC

## 2021-04-20 MED ORDER — HYDROCHLOROTHIAZIDE 12.5 MG PO CAPS: ORAL_CAPSULE | ORAL | 3 refills | Status: DC

## 2021-04-20 NOTE — Telephone Encounter (Signed)
.   LAST APPOINTMENT DATE: 04/14/2021   NEXT APPOINTMENT DATE:@9 /11/2020  MEDICATION:hydrochlorothiazide (MICROZIDE) 12.5 MG capsule  lisinopril (ZESTRIL) 10 MG tablet   PHARMACY:[id number] - [place], [place] - [address] AT [place]

## 2021-05-23 ENCOUNTER — Ambulatory Visit (INDEPENDENT_AMBULATORY_CARE_PROVIDER_SITE_OTHER): Payer: Medicare Other | Admitting: Family

## 2021-05-23 ENCOUNTER — Encounter: Payer: Self-pay | Admitting: Family

## 2021-05-23 ENCOUNTER — Other Ambulatory Visit: Payer: Self-pay

## 2021-05-23 VITALS — BP 160/95 | HR 61 | Temp 97.9°F | Ht 72.0 in | Wt 180.8 lb

## 2021-05-23 DIAGNOSIS — K117 Disturbances of salivary secretion: Secondary | ICD-10-CM | POA: Diagnosis not present

## 2021-05-23 DIAGNOSIS — F17220 Nicotine dependence, chewing tobacco, uncomplicated: Secondary | ICD-10-CM | POA: Diagnosis not present

## 2021-05-23 DIAGNOSIS — I10 Essential (primary) hypertension: Secondary | ICD-10-CM

## 2021-05-23 LAB — COMPREHENSIVE METABOLIC PANEL
ALT: 20 U/L (ref 0–53)
AST: 23 U/L (ref 0–37)
Albumin: 4.5 g/dL (ref 3.5–5.2)
Alkaline Phosphatase: 76 U/L (ref 39–117)
BUN: 15 mg/dL (ref 6–23)
CO2: 30 mEq/L (ref 19–32)
Calcium: 9.7 mg/dL (ref 8.4–10.5)
Chloride: 103 mEq/L (ref 96–112)
Creatinine, Ser: 1.16 mg/dL (ref 0.40–1.50)
GFR: 65.84 mL/min (ref >60.00)
Glucose, Bld: 81 mg/dL (ref 70–99)
Potassium: 3.7 mEq/L (ref 3.5–5.1)
Sodium: 140 mEq/L (ref 135–145)
Total Bilirubin: 0.7 mg/dL (ref 0.2–1.2)
Total Protein: 6.9 g/dL (ref 6.0–8.3)

## 2021-05-23 LAB — CBC WITH DIFFERENTIAL/PLATELET
Basophils Absolute: 0 10*3/uL (ref 0.0–0.1)
Basophils Relative: 0.5 % (ref 0.0–3.0)
Eosinophils Absolute: 0.1 10*3/uL (ref 0.0–0.7)
Eosinophils Relative: 1.7 % (ref 0.0–5.0)
HCT: 41.4 % (ref 39.0–52.0)
Hemoglobin: 14.6 g/dL (ref 13.0–17.0)
Lymphocytes Relative: 29.4 % (ref 12.0–46.0)
Lymphs Abs: 1.9 10*3/uL (ref 0.7–4.0)
MCHC: 35.3 g/dL (ref 30.0–36.0)
MCV: 90.8 fl (ref 78.0–100.0)
Monocytes Absolute: 0.5 10*3/uL (ref 0.1–1.0)
Monocytes Relative: 7.9 % (ref 3.0–12.0)
Neutro Abs: 4 10*3/uL (ref 1.4–7.7)
Neutrophils Relative %: 60.5 % (ref 43.0–77.0)
Platelets: 163 10*3/uL (ref 150.0–400.0)
RBC: 4.56 Mil/uL (ref 4.22–5.81)
RDW: 12.4 % (ref 11.5–15.5)
WBC: 6.6 10*3/uL (ref 4.0–10.5)

## 2021-05-23 LAB — TSH: TSH: 2.54 u[IU]/mL (ref 0.35–4.50)

## 2021-05-23 NOTE — Progress Notes (Signed)
Acute Office Visit  Subjective:    Patient ID: Barry Tanner, male    DOB: 1956-08-04, 65 y.o.   MRN: [id number]  No chief complaint on file.   HPI Patient is in today with complaints of dry mouth that is been going on 1 month.  Denies any changes in medications.  No other symptoms.  Patient reports that he did see his dentist regarding the dry mouth who did an oral cancer screening that was negative.  Patient reports that he chews tobacco.  No known autoimmune conditions that he is aware of or family history of such.  He is on hydrochlorothiazide for blood pressure reduction.  History reviewed. No pertinent past medical history.  Past Surgical History:  Procedure Laterality Date  . CHOLECYSTECTOMY  1998  . HERNIA REPAIR  2008    Family History  Problem Relation Age of Onset  . Stroke Mother   . Hypertension Mother   . Diabetes Mother   . Hearing loss Father     Social History   Socioeconomic History  . Marital status: Divorced    Spouse name: Not on file  . Number of children: Not on file  . Years of education: Not on file  . Highest education level: Not on file  Occupational History  . Not on file  Tobacco Use  . Smoking status: Former    Pack years: 0.00  . Smokeless tobacco: Former  Media planner  . Vaping Use: Never used  Substance and Sexual Activity  . Alcohol use: Yes  . Drug use: Never  . Sexual activity: Not on file  Other Topics Concern  . Not on file  Social History Narrative  . Not on file   Social Determinants of Health   Financial Resource Strain: Not on file  Food Insecurity: Not on file  Transportation Needs: Not on file  Physical Activity: Not on file  Stress: Not on file  Social Connections: Not on file  Intimate Partner Violence: Not on file    Outpatient Medications Prior to Visit  Medication Sig Dispense Refill  . Ascorbic Acid (VITAMIN C) 1000 MG tablet Take 1,000 mg by mouth daily.    . fluorouracil (EFUDEX) 5 % cream     .  GLUCOSAMINE-CHONDROIT-VIT C-MN PO Take by mouth daily.    . hydrochlorothiazide (MICROZIDE) 12.5 MG capsule Take one tablet by mouth daily 90 capsule 3  . lisinopril (ZESTRIL) 10 MG tablet Take one tablet by mouth twice a day 180 tablet 3  . Multiple Vitamin (MULTIVITAMIN ADULT PO) Take by mouth daily.    . mupirocin ointment (BACTROBAN) 2 %     . Omega-3 Fatty Acids (FISH OIL) 1000 MG CAPS Take 2,000 mg by mouth 2 (two) times daily.    . rosuvastatin (CRESTOR) 5 MG tablet TAKE 1 TABLET(5 MG) BY MOUTH DAILY 90 tablet 3  . sildenafil (VIAGRA) 100 MG tablet take one tablet prior to sexual activity 10 tablet 2  . triamcinolone (KENALOG) 0.1 %     . UNABLE TO FIND [place] [employer] foods/ Fruits and vegetables.     No facility-administered medications prior to visit.    No Known Allergies  Review of Systems  HENT:  Negative for ear discharge, ear pain, sinus pressure and sinus pain.        Dry mouth  Respiratory: Negative.    Cardiovascular: Negative.   Endocrine: Negative.   Musculoskeletal: Negative.   Skin: Negative.   Allergic/Immunologic: Negative.   Neurological: Negative.  All other systems reviewed and are negative.     Objective:    Physical Exam Vitals and nursing note reviewed.  Constitutional:      Appearance: Normal appearance.  HENT:     Right Ear: Ear canal and external ear normal.     Left Ear: Ear canal and external ear normal.     Mouth/Throat:     Mouth: Mucous membranes are dry.     Pharynx: Oropharynx is clear. No oropharyngeal exudate or posterior oropharyngeal erythema.  Cardiovascular:     Rate and Rhythm: Normal rate and regular rhythm.  Pulmonary:     Effort: Pulmonary effort is normal.     Breath sounds: Normal breath sounds.  Abdominal:     General: Abdomen is flat.     Palpations: Abdomen is soft.  Musculoskeletal:     Cervical back: Normal range of motion and neck supple.  Skin:    General: Skin is warm and dry.  Neurological:     General: No  focal deficit present.     Mental Status: He is alert and oriented to person, place, and time.  Psychiatric:        Mood and Affect: Mood normal.        Behavior: Behavior normal.   BP (!) 160/95   Pulse 61   Temp 97.9 F (36.6 C) (Temporal)   Ht 6' (1.829 m)   Wt 180 lb 12.8 oz (82 kg)   SpO2 98%   BMI 24.52 kg/m  Wt Readings from Last 3 Encounters:  05/23/21 180 lb 12.8 oz (82 kg)  01/20/21 182 lb 9.6 oz (82.8 kg)  07/20/20 177 lb 12.8 oz (80.6 kg)    Health Maintenance Due  Topic Date Due  . Zoster Vaccines- Shingrix (1 of 2) Never done    There are no preventive care reminders to display for this patient.   Lab Results  Component Value Date   TSH 2.56 07/13/2019   Lab Results  Component Value Date   WBC 7.5 01/20/2021   HGB 16.7 01/20/2021   HCT 48.7 01/20/2021   MCV 91.4 01/20/2021   PLT 196.0 01/20/2021   Lab Results  Component Value Date   NA 141 01/20/2021   K 4.2 01/20/2021   CO2 32 01/20/2021   GLUCOSE 85 01/20/2021   BUN 17 01/20/2021   CREATININE 1.17 01/20/2021   BILITOT 0.8 01/20/2021   ALKPHOS 80 01/20/2021   AST 23 01/20/2021   ALT 22 01/20/2021   PROT 7.4 01/20/2021   ALBUMIN 4.5 01/20/2021   CALCIUM 9.7 01/20/2021   GFR 65.32 01/20/2021   Lab Results  Component Value Date   CHOL 106 07/20/2020   Lab Results  Component Value Date   HDL 32 (L) 07/20/2020   Lab Results  Component Value Date   LDLCALC 54 07/20/2020   Lab Results  Component Value Date   TRIG 123 07/20/2020   Lab Results  Component Value Date   CHOLHDL 3.3 07/20/2020   No results found for: HGBA1C     Assessment & Plan:   Problem List Items Addressed This Visit     Benign essential HTN   Relevant Orders   CBC w/Diff   TSH   Other Visit Diagnoses     Xerostomia    -  Primary   Relevant Orders   Antinuclear Antib (ANA)   Comp Met (CMET)   CBC w/Diff   TSH   Chewing tobacco nicotine dependence without complication  Relevant Orders   CBC  w/Diff   TSH       Labs obtained today will notify patient pending results.  Consider a drug holiday from hydrochlorothiazide if all labs are normal.  We will follow-up pending results and sooner as needed.    Kennyth Arnold, FNP

## 2021-05-25 LAB — ANA: Anti Nuclear Antibody (ANA): NEGATIVE

## 2021-07-25 ENCOUNTER — Other Ambulatory Visit: Payer: Self-pay | Admitting: Family Medicine

## 2021-07-27 ENCOUNTER — Ambulatory Visit (INDEPENDENT_AMBULATORY_CARE_PROVIDER_SITE_OTHER): Payer: Medicare Other | Admitting: Physician Assistant

## 2021-07-27 ENCOUNTER — Encounter: Payer: Medicare Other | Admitting: Family Medicine

## 2021-07-27 ENCOUNTER — Encounter: Payer: Self-pay | Admitting: Physician Assistant

## 2021-07-27 ENCOUNTER — Other Ambulatory Visit: Payer: Self-pay

## 2021-07-27 VITALS — BP 124/88 | HR 66 | Temp 97.9°F | Ht 72.0 in | Wt 177.2 lb

## 2021-07-27 DIAGNOSIS — I10 Essential (primary) hypertension: Secondary | ICD-10-CM | POA: Diagnosis not present

## 2021-07-27 DIAGNOSIS — F17220 Nicotine dependence, chewing tobacco, uncomplicated: Secondary | ICD-10-CM

## 2021-07-27 DIAGNOSIS — E782 Mixed hyperlipidemia: Secondary | ICD-10-CM | POA: Diagnosis not present

## 2021-07-27 DIAGNOSIS — L237 Allergic contact dermatitis due to plants, except food: Secondary | ICD-10-CM | POA: Diagnosis not present

## 2021-07-27 LAB — CBC WITH DIFFERENTIAL/PLATELET
Basophils Absolute: 0 10*3/uL (ref 0.0–0.1)
Basophils Relative: 0.4 % (ref 0.0–3.0)
Eosinophils Absolute: 0.3 10*3/uL (ref 0.0–0.7)
Eosinophils Relative: 4 % (ref 0.0–5.0)
HCT: 44.6 % (ref 39.0–52.0)
Hemoglobin: 15.5 g/dL (ref 13.0–17.0)
Lymphocytes Relative: 28.9 % (ref 12.0–46.0)
Lymphs Abs: 1.9 10*3/uL (ref 0.7–4.0)
MCHC: 34.7 g/dL (ref 30.0–36.0)
MCV: 91.9 fl (ref 78.0–100.0)
Monocytes Absolute: 0.6 10*3/uL (ref 0.1–1.0)
Monocytes Relative: 8.9 % (ref 3.0–12.0)
Neutro Abs: 3.7 10*3/uL (ref 1.4–7.7)
Neutrophils Relative %: 57.8 % (ref 43.0–77.0)
Platelets: 180 10*3/uL (ref 150.0–400.0)
RBC: 4.85 Mil/uL (ref 4.22–5.81)
RDW: 12.2 % (ref 11.5–15.5)
WBC: 6.4 10*3/uL (ref 4.0–10.5)

## 2021-07-27 LAB — COMPREHENSIVE METABOLIC PANEL
ALT: 20 U/L (ref 0–53)
AST: 22 U/L (ref 0–37)
Albumin: 4.4 g/dL (ref 3.5–5.2)
Alkaline Phosphatase: 78 U/L (ref 39–117)
BUN: 14 mg/dL (ref 6–23)
CO2: 29 mEq/L (ref 19–32)
Calcium: 9.5 mg/dL (ref 8.4–10.5)
Chloride: 102 mEq/L (ref 96–112)
Creatinine, Ser: 1.17 mg/dL (ref 0.40–1.50)
GFR: 65.08 mL/min (ref >60.00)
Glucose, Bld: 94 mg/dL (ref 70–99)
Potassium: 4.2 mEq/L (ref 3.5–5.1)
Sodium: 139 mEq/L (ref 135–145)
Total Bilirubin: 0.7 mg/dL (ref 0.2–1.2)
Total Protein: 7.1 g/dL (ref 6.0–8.3)

## 2021-07-27 LAB — LIPID PANEL
Cholesterol: 128 mg/dL (ref 0–200)
HDL: 42.6 mg/dL (ref >39.00)
LDL Cholesterol: 64 mg/dL (ref 0–99)
NonHDL: 85.07
Total CHOL/HDL Ratio: 3
Triglycerides: 106 mg/dL (ref 0.0–149.0)
VLDL: 21.2 mg/dL (ref 0.0–40.0)

## 2021-07-27 MED ORDER — HYDROCHLOROTHIAZIDE 12.5 MG PO CAPS: ORAL_CAPSULE | ORAL | 3 refills | Status: DC

## 2021-07-27 MED ORDER — METHYLPREDNISOLONE 4 MG PO TBPK: ORAL_TABLET | ORAL | 0 refills | Status: DC

## 2021-07-27 NOTE — Progress Notes (Signed)
Established Patient Office Visit  Subjective:  Patient ID: Barry Tanner, male    DOB: February 26, 1956  Age: 65 y.o. MRN: [id number]  CC:  Chief Complaint  Patient presents with   Poison Ivy    HPI [name] presents for [facility] from Dr. Rogers Blocker. Recheck on chronic issues and also needs treatment for poison ivy rash that started this week. See A/P.   History reviewed. No pertinent past medical history.  Past Surgical History:  Procedure Laterality Date   CHOLECYSTECTOMY  1998   HERNIA REPAIR  2008    Family History  Problem Relation Age of Onset   Stroke Mother    Hypertension Mother    Diabetes Mother    Hearing loss Father     Social History   Socioeconomic History   Marital status: Divorced    Spouse name: Not on file   Number of children: Not on file   Years of education: Not on file   Highest education level: Not on file  Occupational History   Not on file  Tobacco Use   Smoking status: Former   Smokeless tobacco: Former  Scientific laboratory technician Use: Never used  Substance and Sexual Activity   Alcohol use: Yes   Drug use: Never   Sexual activity: Not on file  Other Topics Concern   Not on file  Social History Narrative   Not on file   Social Determinants of Health   Financial Resource Strain: Not on file  Food Insecurity: Not on file  Transportation Needs: Not on file  Physical Activity: Not on file  Stress: Not on file  Social Connections: Not on file  Intimate Partner Violence: Not on file    Outpatient Medications Prior to Visit  Medication Sig Dispense Refill   Ascorbic Acid (VITAMIN C) 1000 MG tablet Take 1,000 mg by mouth daily.     fluorouracil (EFUDEX) 5 % cream      GLUCOSAMINE-CHONDROIT-VIT C-MN PO Take by mouth daily.     lisinopril (ZESTRIL) 10 MG tablet Take one tablet by mouth twice a day 180 tablet 3   Multiple Vitamin (MULTIVITAMIN ADULT PO) Take by mouth daily.     Omega-3 Fatty Acids (FISH OIL) 1000 MG CAPS Take 2,000 mg by  mouth 2 (two) times daily.     rosuvastatin (CRESTOR) 5 MG tablet TAKE 1 TABLET(5 MG) BY MOUTH DAILY 90 tablet 0   sildenafil (VIAGRA) 100 MG tablet take one tablet prior to sexual activity 10 tablet 2   UNABLE TO FIND [place] Super foods/ Fruits and vegetables.     hydrochlorothiazide (MICROZIDE) 12.5 MG capsule Take one tablet by mouth daily 90 capsule 3   mupirocin ointment (BACTROBAN) 2 %      triamcinolone (KENALOG) 0.1 %  (Patient not taking: Reported on 07/27/2021)     No facility-administered medications prior to visit.    No Known Allergies  ROS Review of Systems  Constitutional:  Negative for chills, fatigue and fever.  HENT:  Negative for congestion, dental problem, ear pain, hearing loss and trouble swallowing.   Eyes:  Negative for visual disturbance.  Respiratory:  Negative for cough, chest tightness and shortness of breath.   Cardiovascular:  Negative for chest pain, palpitations and leg swelling.  Gastrointestinal:  Negative for abdominal pain, blood in stool, diarrhea and nausea.  Endocrine: Negative for cold intolerance, polydipsia, polyphagia and polyuria.  Genitourinary:  Negative for dysuria and hematuria.  Musculoskeletal:  Negative for arthralgias.  Skin:  Positive for rash.  Neurological:  Negative for dizziness and headaches.  Psychiatric/Behavioral:  Negative for dysphoric mood and sleep disturbance. The patient is not nervous/anxious.      Objective:    Physical Exam Vitals reviewed.  Constitutional:      Appearance: Normal appearance. He is well-developed and normal weight.  HENT:     Head: Normocephalic and atraumatic.     Right Ear: Ear canal and external ear normal.     Left Ear: Ear canal and external ear normal.  Eyes:     Conjunctiva/sclera: Conjunctivae normal.     Pupils: Pupils are equal, round, and reactive to light.  Neck:     Thyroid: No thyromegaly.     Vascular: No carotid bruit.  Cardiovascular:     Rate and Rhythm: Normal rate and  regular rhythm.     Pulses: Normal pulses.     Heart sounds: Normal heart sounds. No murmur heard. Pulmonary:     Effort: Pulmonary effort is normal.     Breath sounds: Normal breath sounds.  Abdominal:     General: Abdomen is flat. Bowel sounds are normal. There is no distension.     Palpations: Abdomen is soft.     Tenderness: There is no abdominal tenderness.  Musculoskeletal:     Cervical back: Normal range of motion and neck supple.  Lymphadenopathy:     Cervical: No cervical adenopathy.  Skin:    General: Skin is warm and dry.     Capillary Refill: Capillary refill takes less than 2 seconds.     Findings: Rash present.     Comments: Erythematous diffuse patchy rash scattered on arms and legs with signs of excoriation  Neurological:     General: No focal deficit present.     Mental Status: He is alert and oriented to person, place, and time.     Cranial Nerves: No cranial nerve deficit.     Coordination: Coordination normal.     Deep Tendon Reflexes: Reflexes normal.  Psychiatric:        Mood and Affect: Mood normal.        Behavior: Behavior normal.    BP 124/88   Pulse 66   Temp 97.9 F (36.6 C)   Ht 6' (1.829 m)   Wt 177 lb 3.2 oz (80.4 kg)   SpO2 97%   BMI 24.03 kg/m  Wt Readings from Last 3 Encounters:  07/27/21 177 lb 3.2 oz (80.4 kg)  05/23/21 180 lb 12.8 oz (82 kg)  01/20/21 182 lb 9.6 oz (82.8 kg)     Health Maintenance Due  Topic Date Due   Zoster Vaccines- Shingrix (1 of 2) Never done   PNA vac Low Risk Adult (2 of 2 - PCV13) 07/20/2021    There are no preventive care reminders to display for this patient.  Lab Results  Component Value Date   TSH 2.54 05/23/2021   Lab Results  Component Value Date   WBC 6.6 05/23/2021   HGB 14.6 05/23/2021   HCT 41.4 05/23/2021   MCV 90.8 05/23/2021   PLT 163.0 05/23/2021   Lab Results  Component Value Date   NA 140 05/23/2021   K 3.7 05/23/2021   CO2 30 05/23/2021   GLUCOSE 81 05/23/2021   BUN  15 05/23/2021   CREATININE 1.16 05/23/2021   BILITOT 0.7 05/23/2021   ALKPHOS 76 05/23/2021   AST 23 05/23/2021   ALT 20 05/23/2021   PROT 6.9 05/23/2021  ALBUMIN 4.5 05/23/2021   CALCIUM 9.7 05/23/2021   GFR 65.84 05/23/2021   Lab Results  Component Value Date   CHOL 106 07/20/2020   Lab Results  Component Value Date   HDL 32 (L) 07/20/2020   Lab Results  Component Value Date   LDLCALC 54 07/20/2020   Lab Results  Component Value Date   TRIG 123 07/20/2020   Lab Results  Component Value Date   CHOLHDL 3.3 07/20/2020   No results found for: HGBA1C    Assessment & Plan:   Problem List Items Addressed This Visit       Cardiovascular and Mediastinum   Benign essential HTN - Primary   Relevant Medications   hydrochlorothiazide (MICROZIDE) 12.5 MG capsule   Other Relevant Orders   CBC with Differential/Platelet   Comprehensive metabolic panel     Other   Hyperlipidemia   Relevant Medications   hydrochlorothiazide (MICROZIDE) 12.5 MG capsule   Other Relevant Orders   Lipid panel   Other Visit Diagnoses     Chewing tobacco nicotine dependence without complication       Contact dermatitis due to poison ivy           Meds ordered this encounter  Medications   methylPREDNISolone (MEDROL DOSEPAK) 4 MG TBPK tablet    Sig: Please take per packaging instructions.    Dispense:  21 tablet    Refill:  0   hydrochlorothiazide (MICROZIDE) 12.5 MG capsule    Sig: Take one tablet by mouth daily    Dispense:  90 capsule    Refill:  3   1. Benign essential HTN To goal -Continue Microzide 12.5 mg daily, Lisinopril 10 mg daily -Low salt diet -He is doing well with walking his dogs and going to the gym  2. Mixed hyperlipidemia Labs today -Continue Crestor 5 mg daily  3. Chewing tobacco nicotine dependence without complication Still chewing daily. Dental visits every 6 months. Advised to quit.  4. Contact dermatitis due to poison ivy -Medrol dose  pak -keep cool, hydrated -Calamine lotion, hydrocortisone cream -Recheck prn   Follow-up: Return in about 6 months (around 01/24/2022) for med check .    Halayna Blane M Marjean Imperato, PA-C

## 2021-07-27 NOTE — Patient Instructions (Addendum)
Good to meet you today! Please go to the lab for blood work and I will send results through [place]. Continue current medication regimen. Take the medrol dose pak for poison ivy.

## 2021-08-23 ENCOUNTER — Telehealth: Payer: Self-pay | Admitting: Physician Assistant

## 2021-08-23 NOTE — Telephone Encounter (Signed)
Copied from [place] [phone]. Topic: Medicare AWV >> Aug 23, 2021 10:40 AM Harris-Coley, Hannah Beat wrote: Reason for CRM: Left message for patient to schedule Annual Wellness Visit.  Please schedule with Nurse Health Advisor Charlott Rakes, RN at [facility].  Please call [phone] ask for [facility]

## 2021-10-03 ENCOUNTER — Ambulatory Visit (INDEPENDENT_AMBULATORY_CARE_PROVIDER_SITE_OTHER): Payer: Medicare Other

## 2021-10-03 ENCOUNTER — Other Ambulatory Visit: Payer: Self-pay

## 2021-10-03 DIAGNOSIS — Z23 Encounter for immunization: Secondary | ICD-10-CM

## 2021-10-11 ENCOUNTER — Other Ambulatory Visit: Payer: Self-pay | Admitting: Physician Assistant

## 2021-11-29 ENCOUNTER — Encounter: Payer: Self-pay | Admitting: Physician Assistant

## 2021-11-29 ENCOUNTER — Telehealth (INDEPENDENT_AMBULATORY_CARE_PROVIDER_SITE_OTHER): Payer: Medicare Other | Admitting: Physician Assistant

## 2021-11-29 VITALS — BP 122/84 | HR 57 | Temp 98.7°F | Wt 178.0 lb

## 2021-11-29 DIAGNOSIS — U071 COVID-19: Secondary | ICD-10-CM

## 2021-11-29 MED ORDER — MOLNUPIRAVIR EUA 200MG CAPSULE: 4.0000 | ORAL_CAPSULE | Freq: Two times a day (BID) | ORAL | 0 refills | Status: AC

## 2021-11-29 NOTE — Progress Notes (Signed)
Virtual Visit via Video Note  I connected with  Barry Tanner  on 11/29/21 at  8:00 AM EST by a video enabled telemedicine application and verified that I am speaking with the correct person using two identifiers.  Location: Patient: home Provider: Therapist, music at [place] present: Patient and myself   I discussed the limitations of evaluation and management by telemedicine and the availability of in person appointments. The patient expressed understanding and agreed to proceed.   History of Present Illness:  Chief complaint: COVID-19 positive home test on 11/28/21 Symptom onset: 11/27/21 Pertinent positives: Hoarseness, postnasal drip, ST, fever / chills, HA Pertinent negatives: CP, SOB, cough, N/V/D   Treatments tried: Ibuprofen every 4-6 hours  Vaccine status: first two COVID-19 vaccines, flu shot UTD this year  Sick exposure: Unsure of sick contacts, but just returned from [place] last week after one month there     Observations/Objective:  Self-reported vitals obtained from the patient: Wt 178 BP - 122/84 Heart rate - 57 Temp 98.7   Gen: Awake, alert, no acute distress, hoarse voice, some congestion  Resp: Breathing is even and non-labored Psych: calm/pleasant demeanor Neuro: Alert and Oriented x 3, + facial symmetry, speech is clear.   Assessment and Plan:  1. COVID-19 Diagnosis confirmed via home antigen test.  Patient is currently having mild symptoms.  We discussed current algorithm recommendations for prescribing outpatient antivirals.  As the patient is over the age of 28 and still within 5-day window onset of symptoms, he could benefit from antiviral therapy.  Risks versus benefits discussed.  We both agreed that Molnupiravir would be the best choice for him at this time and possible side effects were discussed.  Advised self-isolation at home for the next 5 days and then masking around others for at least an additional 5 days.  Treat  supportively at this time including sleeping prone, deep breathing exercises, pushing fluids, walking every few hours, vitamins C and D, and Tylenol or ibuprofen as needed.  The patient understands that COVID-19 illness can wax and wane.  Should the symptoms acutely worsen or patient starts to experience sudden shortness of breath, chest pain, severe weakness, the patient will go straight to the emergency department.  Also advised home pulse oximetry monitoring and for any reading consistently under 92%, should also report to the emergency department.  The patient will continue to keep [place] updated.    Follow Up Instructions:    I discussed the assessment and treatment plan with the patient. The patient was provided an opportunity to ask questions and all were answered. The patient agreed with the plan and demonstrated an understanding of the instructions.   The patient was advised to call back or seek an in-person evaluation if the symptoms worsen or if the condition fails to improve as anticipated.  Palyn Scrima M Merwin Breden, PA-C

## 2021-12-06 ENCOUNTER — Other Ambulatory Visit: Payer: Self-pay

## 2021-12-06 ENCOUNTER — Encounter: Payer: Self-pay | Admitting: Physician Assistant

## 2021-12-06 MED ORDER — ROSUVASTATIN CALCIUM 5 MG PO TABS: 5.0000 mg | ORAL_TABLET | Freq: Every day | ORAL | 1 refills | Status: DC

## 2021-12-20 ENCOUNTER — Telehealth: Payer: Self-pay | Admitting: Physician Assistant

## 2021-12-20 NOTE — Telephone Encounter (Signed)
Copied from [place] [id number]. Topic: Medicare AWV >> Dec 20, 2021 10:01 AM Harris-Coley, Hannah Beat wrote: Reason for CRM: Left message for patient to schedule Annual Wellness Visit.  Please schedule with Nurse Health Advisor Charlott Rakes, RN at [facility].  Please call [phone] ask for [facility]

## 2022-01-09 ENCOUNTER — Other Ambulatory Visit: Payer: Self-pay | Admitting: Family Medicine

## 2022-01-24 ENCOUNTER — Other Ambulatory Visit: Payer: Self-pay

## 2022-01-24 ENCOUNTER — Ambulatory Visit (INDEPENDENT_AMBULATORY_CARE_PROVIDER_SITE_OTHER): Payer: Medicare Other | Admitting: Physician Assistant

## 2022-01-24 ENCOUNTER — Encounter: Payer: Self-pay | Admitting: Physician Assistant

## 2022-01-24 VITALS — BP 124/82 | HR 58 | Temp 97.8°F | Resp 15 | Wt 175.8 lb

## 2022-01-24 DIAGNOSIS — I10 Essential (primary) hypertension: Secondary | ICD-10-CM

## 2022-01-24 DIAGNOSIS — H6123 Impacted cerumen, bilateral: Secondary | ICD-10-CM

## 2022-01-24 DIAGNOSIS — E782 Mixed hyperlipidemia: Secondary | ICD-10-CM | POA: Diagnosis not present

## 2022-01-24 DIAGNOSIS — F17220 Nicotine dependence, chewing tobacco, uncomplicated: Secondary | ICD-10-CM

## 2022-01-24 MED ORDER — HYDROCHLOROTHIAZIDE 12.5 MG PO CAPS: ORAL_CAPSULE | ORAL | 3 refills | Status: DC

## 2022-01-24 MED ORDER — ROSUVASTATIN CALCIUM 5 MG PO TABS: 5.0000 mg | ORAL_TABLET | Freq: Every day | ORAL | 3 refills | Status: DC

## 2022-01-24 MED ORDER — LISINOPRIL 10 MG PO TABS: ORAL_TABLET | ORAL | 3 refills | Status: DC

## 2022-01-24 NOTE — Progress Notes (Signed)
? ?Subjective:  ? ? Patient ID: Barry Tanner, male    DOB: Nov 28, 1955, 66 y.o.   MRN: [id number] ? ?Chief Complaint  ?Patient presents with  ? Hyperlipidemia  ? Hypertension  ? Ear Fullness  ?  Mainly right ear, left over from [place]  ?Looks like right ear is full of wax  ? ? ?HPI ?Patient is in today for f/up on chronic conditions. See A/P for discussion. No new concerns. ? ?History reviewed. No pertinent past medical history. ? ?Past Surgical History:  ?Procedure Laterality Date  ? CHOLECYSTECTOMY  1998  ? HERNIA REPAIR  2008  ? ? ?Family History  ?Problem Relation Age of Onset  ? Stroke Mother   ? Hypertension Mother   ? Diabetes Mother   ? Hearing loss Father   ? ? ?Social History  ? ?Tobacco Use  ? Smoking status: Former  ? Smokeless tobacco: Former  ?Vaping Use  ? Vaping Use: Never used  ?Substance Use Topics  ? Alcohol use: Yes  ? Drug use: Never  ?  ? ?No Known Allergies ? ?Review of Systems ?NEGATIVE UNLESS OTHERWISE INDICATED IN HPI ? ? ?   ?Objective:  ?  ? ?BP 124/82   Pulse (!) 58   Temp 97.8 ?F (36.6 ?C) (Temporal)   Resp 15   Wt 175 lb 12.8 oz (79.7 kg)   SpO2 97%   BMI 23.84 kg/m?  ? ?Wt Readings from Last 3 Encounters:  ?01/24/22 175 lb 12.8 oz (79.7 kg)  ?11/29/21 178 lb (80.7 kg)  ?07/27/21 177 lb 3.2 oz (80.4 kg)  ? ? ?BP Readings from Last 3 Encounters:  ?01/24/22 124/82  ?11/29/21 122/84  ?07/27/21 124/88  ?  ? ?Physical Exam ?Vitals and nursing note reviewed.  ?Constitutional:   ?   General: He is not in acute distress. ?   Appearance: Normal appearance. He is not toxic-appearing.  ?HENT:  ?   Head: Normocephalic and atraumatic.  ?   Right Ear: Tympanic membrane and external ear normal. There is impacted cerumen.  ?   Left Ear: Tympanic membrane and external ear normal. There is impacted cerumen.  ?   Nose: Nose normal.  ?   Mouth/Throat:  ?   Mouth: Mucous membranes are moist.  ?   Pharynx: Oropharynx is clear.  ?Eyes:  ?   Extraocular Movements: Extraocular movements intact.  ?    Conjunctiva/sclera: Conjunctivae normal.  ?   Pupils: Pupils are equal, round, and reactive to light.  ?Cardiovascular:  ?   Rate and Rhythm: Normal rate and regular rhythm.  ?   Pulses: Normal pulses.  ?   Heart sounds: Normal heart sounds.  ?Pulmonary:  ?   Effort: Pulmonary effort is normal.  ?   Breath sounds: Normal breath sounds.  ?Abdominal:  ?   General: Abdomen is flat. Bowel sounds are normal.  ?   Palpations: Abdomen is soft.  ?   Tenderness: There is no abdominal tenderness.  ?Musculoskeletal:     ?   General: Normal range of motion.  ?   Cervical back: Normal range of motion and neck supple.  ?Skin: ?   General: Skin is warm and dry.  ?Neurological:  ?   General: No focal deficit present.  ?   Mental Status: He is alert and oriented to person, place, and time.  ?Psychiatric:     ?   Mood and Affect: Mood normal.     ?   Behavior: Behavior normal.  ? ? ?   ?  Assessment & Plan:  ? ?Problem List Items Addressed This Visit   ? ?  ? Cardiovascular and Mediastinum  ? Benign essential HTN - Primary  ? Relevant Medications  ? rosuvastatin (CRESTOR) 5 MG tablet  ? hydrochlorothiazide (MICROZIDE) 12.5 MG capsule  ? lisinopril (ZESTRIL) 10 MG tablet  ?  ? Other  ? Mixed hyperlipidemia  ? Relevant Medications  ? rosuvastatin (CRESTOR) 5 MG tablet  ? hydrochlorothiazide (MICROZIDE) 12.5 MG capsule  ? lisinopril (ZESTRIL) 10 MG tablet  ? Chewing tobacco nicotine dependence without complication  ? ?Other Visit Diagnoses   ? ? Bilateral impacted cerumen      ? ?  ? ? ? ?Meds ordered this encounter  ?Medications  ? rosuvastatin (CRESTOR) 5 MG tablet  ?  Sig: Take 1 tablet (5 mg total) by mouth daily.  ?  Dispense:  90 tablet  ?  Refill:  3  ? hydrochlorothiazide (MICROZIDE) 12.5 MG capsule  ?  Sig: Take one tablet by mouth daily  ?  Dispense:  90 capsule  ?  Refill:  3  ? lisinopril (ZESTRIL) 10 MG tablet  ?  Sig: TAKE 1 TABLET(10 MG) BY MOUTH TWICE DAILY  ?  Dispense:  180 tablet  ?  Refill:  3  ? ? ?1. Benign essential  HTN ?Stable, to goal, doing well with diet  ?Lisinopril 10 mg and HCTZ 12.5 mg ?Sent refills today ? ?2. Mixed hyperlipidemia ?Doing well with Crestor 5 mg, will update fasting lipid panel annually ?Lab Results  ?Component Value Date  ? CHOL 128 07/27/2021  ? HDL 42.60 07/27/2021  ? [place] 64 07/27/2021  ? TRIG 106.0 07/27/2021  ? CHOLHDL 3 07/27/2021  ? ? ?3. Chewing tobacco nicotine dependence without complication ?Congratulated him on quitting this!  ? ? ?4. Bilateral impacted cerumen ?Ceruminosis is noted. Removal procedure explained and verbal consent obtained from patient. Wax is removed by syringing from bilateral ears. Pt tolerated procedure well. Instructions for home care to prevent wax buildup are given. ? ? ?Tarnesha Ulloa M Gurshaan Matsuoka, PA-C ?

## 2022-01-24 NOTE — Patient Instructions (Signed)
Great to see you today!  ?I'm glad you're doing so well and congrats on quitting the tobacco!!! ?Awesome work, keep up the good job with lifestyle changes. ? ?

## 2022-05-24 ENCOUNTER — Encounter: Payer: Self-pay | Admitting: Physician Assistant

## 2022-05-28 ENCOUNTER — Encounter: Payer: Self-pay | Admitting: Family Medicine

## 2022-05-28 ENCOUNTER — Ambulatory Visit (INDEPENDENT_AMBULATORY_CARE_PROVIDER_SITE_OTHER): Payer: Medicare Other | Admitting: Family Medicine

## 2022-05-28 VITALS — BP 118/70 | HR 71 | Temp 98.2°F | Ht 72.0 in | Wt 173.1 lb

## 2022-05-28 DIAGNOSIS — K4091 Unilateral inguinal hernia, without obstruction or gangrene, recurrent: Secondary | ICD-10-CM

## 2022-05-28 NOTE — Progress Notes (Signed)
   Subjective:     Patient ID: Barry Tanner, male    DOB: 07/26/1956, 65 y.o.   MRN: [id number]  Chief Complaint  Patient presents with   Hernia    Bulging hernia that is causing pain, left side groin area    HPI L ing hernia-repaired about 1998.bulging around Feb/March.  Was prior but more after heavy lifting.  Occ sharp/burning pain-more when lying down and relaxing-lasts about 2 min.  No n/v.   No specific injury this time. Thinks mesh in 1998.   There are no preventive care reminders to display for this patient.  History reviewed. No pertinent past medical history.  Past Surgical History:  Procedure Laterality Date   [place]  2008    Outpatient Medications Prior to Visit  Medication Sig Dispense Refill   Ascorbic Acid (VITAMIN C) 1000 MG tablet Take 1,000 mg by mouth daily.     fluorouracil (EFUDEX) 5 % cream      GLUCOSAMINE-CHONDROIT-VIT C-MN PO 1 PO QD ORAL     hydrochlorothiazide (MICROZIDE) 12.5 MG capsule Take one tablet by mouth daily 90 capsule 3   lisinopril (ZESTRIL) 10 MG tablet TAKE 1 TABLET(10 MG) BY MOUTH TWICE DAILY 180 tablet 3   Multiple Vitamin (MULTIVITAMIN ADULT PO) Take by mouth daily.     rosuvastatin (CRESTOR) 5 MG tablet Take 1 tablet (5 mg total) by mouth daily. 90 tablet 3   sildenafil (VIAGRA) 100 MG tablet take one tablet prior to sexual activity 10 tablet 2   UNABLE TO FIND [place] Super foods/ Fruits and vegetables.     No facility-administered medications prior to visit.    No Known Allergies ROS neg/noncontributory except as noted HPI/below Walks dog 3 miles/day.  No cp/sob      Objective:     BP 118/70   Pulse 71   Temp 98.2 F (36.8 C) (Temporal)   Ht 6' (1.829 m)   Wt 173 lb 2 oz (78.5 kg)   SpO2 98%   BMI 23.48 kg/m  Wt Readings from Last 3 Encounters:  05/28/22 173 lb 2 oz (78.5 kg)  01/24/22 175 lb 12.8 oz (79.7 kg)  11/29/21 178 lb (80.7 kg)    Physical Exam   Gen: WDWN NAD  wm HEENT: NCAT, conjunctiva not injected, sclera nonicteric.  Lazy eye NECK:  supple, no thyromegaly, no nodes, no carotid bruits CARDIAC: RRR, S1S2+, no murmur. DP 2+B LUNGS: CTAB. No wheezes ABDOMEN:  BS+, soft, NTND, No HSM, no masses-+L bulg hernia.  Non tender.  Not into scrotum (ahaperone QJ present) EXT:  no edema MSK: no gross abnormalities.  NEURO: A&O x3.  CN II-XII intact.  PSYCH: normal mood. Good eye contact     Assessment & Plan:   Problem List Items Addressed This Visit   None Visit Diagnoses     Unilateral recurrent inguinal hernia without obstruction or gangrene    -  Primary      L recurrant inguinal hernia-refer surg.  Warning signs discussed for ER   No orders of the defined types were placed in this encounter.   Wellington Hampshire, MD

## 2022-05-28 NOTE — Patient Instructions (Signed)
It was very nice to see you today!  If severe pain, nausea/vomiting, to ER Referral sent   PLEASE NOTE:  If you had any lab tests please let us know if you have not heard back within a few days. You may see your results on MyChart before we have a chance to review them but we will give you a call once they are reviewed by [place]. If we ordered any referrals today, please let us know if you have not heard from their office within the next week.   Please try these tips to maintain a healthy lifestyle:  Eat most of your calories during the day when you are active. Eliminate processed foods including packaged sweets (pies, cakes, cookies), reduce intake of potatoes, white bread, white pasta, and white rice. Look for whole grain options, oat flour or almond flour.  Each meal should contain half fruits/vegetables, one quarter protein, and one quarter carbs (no bigger than a computer mouse).  Cut down on sweet beverages. This includes juice, soda, and sweet tea. Also watch fruit intake, though this is a healthier sweet option, it still contains natural sugar! Limit to 3 servings daily.  Drink at least 1 glass of water with each meal and aim for at least 8 glasses per day  Exercise at least 150 minutes every week.

## 2022-07-16 ENCOUNTER — Other Ambulatory Visit: Payer: Self-pay | Admitting: Surgery

## 2022-08-02 HISTORY — PX: INCISIONAL HERNIA REPAIR: SHX193

## 2022-08-20 ENCOUNTER — Encounter: Payer: Self-pay | Admitting: *Deleted

## 2022-08-22 ENCOUNTER — Ambulatory Visit (INDEPENDENT_AMBULATORY_CARE_PROVIDER_SITE_OTHER): Payer: Medicare Other | Admitting: Physician Assistant

## 2022-08-22 ENCOUNTER — Encounter: Payer: Self-pay | Admitting: Physician Assistant

## 2022-08-22 VITALS — BP 126/84 | HR 66 | Temp 97.5°F | Resp 16 | Ht 72.0 in | Wt 177.2 lb

## 2022-08-22 DIAGNOSIS — K4091 Unilateral inguinal hernia, without obstruction or gangrene, recurrent: Secondary | ICD-10-CM

## 2022-08-22 DIAGNOSIS — Z23 Encounter for immunization: Secondary | ICD-10-CM | POA: Diagnosis not present

## 2022-08-22 DIAGNOSIS — Z0000 Encounter for general adult medical examination without abnormal findings: Secondary | ICD-10-CM | POA: Insufficient documentation

## 2022-08-22 DIAGNOSIS — I10 Essential (primary) hypertension: Secondary | ICD-10-CM | POA: Diagnosis not present

## 2022-08-22 DIAGNOSIS — E782 Mixed hyperlipidemia: Secondary | ICD-10-CM | POA: Diagnosis not present

## 2022-08-22 LAB — LIPID PANEL
Cholesterol: 138 mg/dL (ref 0–200)
HDL: 44.6 mg/dL (ref >39.00)
LDL Cholesterol: 64 mg/dL (ref 0–99)
NonHDL: 93.27
Total CHOL/HDL Ratio: 3
Triglycerides: 147 mg/dL (ref 0.0–149.0)
VLDL: 29.4 mg/dL (ref 0.0–40.0)

## 2022-08-22 LAB — COMPREHENSIVE METABOLIC PANEL
ALT: 18 U/L (ref 0–53)
AST: 19 U/L (ref 0–37)
Albumin: 4.5 g/dL (ref 3.5–5.2)
Alkaline Phosphatase: 102 U/L (ref 39–117)
BUN: 12 mg/dL (ref 6–23)
CO2: 32 mEq/L (ref 19–32)
Calcium: 9.8 mg/dL (ref 8.4–10.5)
Chloride: 103 mEq/L (ref 96–112)
Creatinine, Ser: 1.14 mg/dL (ref 0.40–1.50)
GFR: 66.64 mL/min (ref >60.00)
Glucose, Bld: 89 mg/dL (ref 70–99)
Potassium: 4.3 mEq/L (ref 3.5–5.1)
Sodium: 141 mEq/L (ref 135–145)
Total Bilirubin: 0.9 mg/dL (ref 0.2–1.2)
Total Protein: 7 g/dL (ref 6.0–8.3)

## 2022-08-22 NOTE — Assessment & Plan Note (Signed)
Recheck lipid today Keep walking, keep up with healthy lifestyle Crestor 5 mg daily

## 2022-08-22 NOTE — Assessment & Plan Note (Signed)
BP is stable and to goal Cont lisinopril 10 mg BID, HCTZ 12.5 mg QD Monitor at home Keep up good work

## 2022-08-22 NOTE — Assessment & Plan Note (Signed)
Doing well F/up with surgeon Monday as scheduled for 4 week f/up

## 2022-08-22 NOTE — Progress Notes (Signed)
Subjective:    Patient ID: Barry Tanner, male    DOB: May 19, 1956, 66 y.o.   MRN: [id number]  Chief Complaint  Patient presents with   Annual Exam    Fasting  Flu shot today    Hypertension    Not checking BP at home but has monitor    Hypertension   Patient is in today for 6 month recheck. Currently recovering from inguinal hernia repair. Taking medications daily for HTN and cholesterol. Sees dermatology regularly. No new concerns to report today.    History reviewed. No pertinent past medical history.  Past Surgical History:  Procedure Laterality Date   CHOLECYSTECTOMY  1998   HERNIA REPAIR  2008   L 1998 R 2004   INCISIONAL HERNIA REPAIR Left 08/02/2022    Family History  Problem Relation Age of Onset   Stroke Mother    Hypertension Mother    Diabetes Mother    Hearing loss Father     Social History   Tobacco Use   Smoking status: Former   Smokeless tobacco: Former  Scientific laboratory technician Use: Never used  Substance Use Topics   Alcohol use: Yes    Alcohol/week: 10.0 standard drinks of alcohol    Types: 10 Shots of liquor per week   Drug use: Never     No Known Allergies  Review of Systems NEGATIVE UNLESS OTHERWISE INDICATED IN HPI      Objective:     BP 126/84   Pulse 66   Temp (!) 97.5 F (36.4 C) (Temporal)   Resp 16   Ht 6' (1.829 m)   Wt 177 lb 3.2 oz (80.4 kg)   SpO2 96%   BMI 24.03 kg/m   Wt Readings from Last 3 Encounters:  08/22/22 177 lb 3.2 oz (80.4 kg)  05/28/22 173 lb 2 oz (78.5 kg)  01/24/22 175 lb 12.8 oz (79.7 kg)    BP Readings from Last 3 Encounters:  08/22/22 126/84  05/28/22 118/70  01/24/22 124/82     Physical Exam Vitals and nursing note reviewed.  Constitutional:      General: He is not in acute distress.    Appearance: Normal appearance. He is not toxic-appearing.  HENT:     Head: Normocephalic and atraumatic.     Right Ear: Tympanic membrane, ear canal and external ear normal.     Left Ear:  Tympanic membrane, ear canal and external ear normal.     Nose: Nose normal.     Mouth/Throat:     Mouth: Mucous membranes are moist.     Pharynx: Oropharynx is clear.  Eyes:     Extraocular Movements: Extraocular movements intact.     Conjunctiva/sclera: Conjunctivae normal.     Pupils: Pupils are equal, round, and reactive to light.  Cardiovascular:     Rate and Rhythm: Normal rate and regular rhythm.     Pulses: Normal pulses.     Heart sounds: Normal heart sounds.  Pulmonary:     Effort: Pulmonary effort is normal.     Breath sounds: Normal breath sounds.  Musculoskeletal:        General: Normal range of motion.     Cervical back: Normal range of motion and neck supple.     Right lower leg: No edema.     Left lower leg: No edema.  Skin:    General: Skin is warm and dry.     Findings: No rash.  Neurological:  General: No focal deficit present.     Mental Status: He is alert and oriented to person, place, and time.  Psychiatric:        Mood and Affect: Mood normal.        Behavior: Behavior normal.        Assessment & Plan:  Benign essential HTN Assessment & Plan: BP is stable and to goal Cont lisinopril 10 mg BID, HCTZ 12.5 mg QD Monitor at home Keep up good work  Orders: -     Comprehensive metabolic panel -     Lipid panel  Mixed hyperlipidemia Assessment & Plan: Recheck lipid today Keep walking, keep up with healthy lifestyle Crestor 5 mg daily   Orders: -     Lipid panel  Unilateral recurrent inguinal hernia without obstruction or gangrene Assessment & Plan: Doing well F/up with surgeon Monday as scheduled for 4 week f/up    Need for immunization against influenza -     Flu Vaccine QUAD High Dose(Fluad)      Return in about 1 year (around 08/23/2023) for fasting labs, recheck.     Taiya Nutting M Ardena Gangl, PA-C

## 2022-08-27 DIAGNOSIS — M549 Dorsalgia, unspecified: Secondary | ICD-10-CM | POA: Insufficient documentation

## 2022-09-06 ENCOUNTER — Telehealth: Payer: Self-pay | Admitting: Physician Assistant

## 2022-09-06 NOTE — Telephone Encounter (Signed)
Copied from [place] [id number]. Topic: Medicare AWV >> Sep 06, 2022 11:11 AM Devoria Glassing wrote: Reason for RPZ:PSUGAY patient to schedule Annual Wellness Visit.  Please schedule with Nurse Health Advisor Charlott Rakes, RN at [facility]. This appt can be telephone or office visit. Please call [phone] ask for [facility]

## 2022-09-06 NOTE — Progress Notes (Signed)
Erroneous encounter

## 2022-09-06 NOTE — Patient Instructions (Signed)
Health Maintenance, Male Adopting a healthy lifestyle and getting preventive care are important in promoting health and wellness. Ask your health care provider about: The right schedule for you to have regular tests and exams. Things you can do on your own to prevent diseases and keep yourself healthy. What should I know about diet, weight, and exercise? Eat a healthy diet  Eat a diet that includes plenty of vegetables, fruits, low-fat dairy products, and lean protein. Do not eat a lot of foods that are high in solid fats, added sugars, or sodium. Maintain a healthy weight Body mass index (BMI) is a measurement that can be used to identify possible weight problems. It estimates body fat based on height and weight. Your health care provider can help determine your BMI and help you achieve or maintain a healthy weight. Get regular exercise Get regular exercise. This is one of the most important things you can do for your health. Most adults should: Exercise for at least 150 minutes each week. The exercise should increase your heart rate and make you sweat (moderate-intensity exercise). Do strengthening exercises at least twice a week. This is in addition to the moderate-intensity exercise. Spend less time sitting. Even light physical activity can be beneficial. Watch cholesterol and blood lipids Have your blood tested for lipids and cholesterol at 67 years of age, then have this test every 5 years. You may need to have your cholesterol levels checked more often if: Your lipid or cholesterol levels are high. You are older than 67 years of age. You are at high risk for heart disease. What should I know about cancer screening? Many types of cancers can be detected early and may often be prevented. Depending on your health history and family history, you may need to have cancer screening at various ages. This may include screening for: Colorectal cancer. Prostate cancer. Skin cancer. Lung  cancer. What should I know about heart disease, diabetes, and high blood pressure? Blood pressure and heart disease High blood pressure causes heart disease and increases the risk of stroke. This is more likely to develop in people who have high blood pressure readings or are overweight. Talk with your health care provider about your target blood pressure readings. Have your blood pressure checked: Every 3-5 years if you are 18-39 years of age. Every year if you are 40 years old or older. If you are between the ages of 65 and 75 and are a current or former smoker, ask your health care provider if you should have a one-time screening for abdominal aortic aneurysm (AAA). Diabetes Have regular diabetes screenings. This checks your fasting blood sugar level. Have the screening done: Once every three years after age 45 if you are at a normal weight and have a low risk for diabetes. More often and at a younger age if you are overweight or have a high risk for diabetes. What should I know about preventing infection? Hepatitis B If you have a higher risk for hepatitis B, you should be screened for this virus. Talk with your health care provider to find out if you are at risk for hepatitis B infection. Hepatitis C Blood testing is recommended for: Everyone born from 1945 through 1965. Anyone with known risk factors for hepatitis C. Sexually transmitted infections (STIs) You should be screened each year for STIs, including gonorrhea and chlamydia, if: You are sexually active and are younger than 67 years of age. You are older than 67 years of age and your   health care provider tells you that you are at risk for this type of infection. Your sexual activity has changed since you were last screened, and you are at increased risk for chlamydia or gonorrhea. Ask your health care provider if you are at risk. Ask your health care provider about whether you are at high risk for HIV. Your health care provider  may recommend a prescription medicine to help prevent HIV infection. If you choose to take medicine to prevent HIV, you should first get tested for HIV. You should then be tested every 3 months for as long as you are taking the medicine. Follow these instructions at home: Alcohol use Do not drink alcohol if your health care provider tells you not to drink. If you drink alcohol: Limit how much you have to 0-2 drinks a day. Know how much alcohol is in your drink. In the [place] one drink equals one 12 oz bottle of beer (355 mL), one 5 oz glass of Terique Kawabata (148 mL), or one 1 oz glass of hard liquor (44 mL). Lifestyle Do not use any products that contain nicotine or tobacco. These products include cigarettes, chewing tobacco, and vaping devices, such as e-cigarettes. If you need help quitting, ask your health care provider. Do not use street drugs. Do not share needles. Ask your health care provider for help if you need support or information about quitting drugs. General instructions Schedule regular health, dental, and eye exams. Stay current with your vaccines. Tell your health care provider if: You often feel depressed. You have ever been abused or do not feel safe at home. Summary Adopting a healthy lifestyle and getting preventive care are important in promoting health and wellness. Follow your health care provider's instructions about healthy diet, exercising, and getting tested or screened for diseases. Follow your health care provider's instructions on monitoring your cholesterol and blood pressure. This information is not intended to replace advice given to you by your health care provider. Make sure you discuss any questions you have with your health care provider. Document Revised: 04/03/2021 Document Reviewed: 04/03/2021 Elsevier Patient Education  2023 Elsevier Inc.  

## 2022-09-07 ENCOUNTER — Ambulatory Visit (INDEPENDENT_AMBULATORY_CARE_PROVIDER_SITE_OTHER): Payer: Medicare Other | Admitting: *Deleted

## 2022-09-07 DIAGNOSIS — Z0000 Encounter for general adult medical examination without abnormal findings: Secondary | ICD-10-CM

## 2022-12-31 ENCOUNTER — Ambulatory Visit (INDEPENDENT_AMBULATORY_CARE_PROVIDER_SITE_OTHER): Payer: Medicare Other

## 2022-12-31 VITALS — Wt 177.0 lb

## 2022-12-31 DIAGNOSIS — Z0000 Encounter for general adult medical examination without abnormal findings: Secondary | ICD-10-CM

## 2022-12-31 NOTE — Progress Notes (Signed)
I connected with  [name] on 12/31/22 by a audio enabled telemedicine application and verified that I am speaking with the correct person using two identifiers.  Patient Location: Home  Provider Location: Office/Clinic  I discussed the limitations of evaluation and management by telemedicine. The patient expressed understanding and agreed to proceed.   Subjective:   Barry Tanner is a 67 y.o. male who presents for an Initial Medicare Annual Wellness Visit.  Review of Systems     Cardiac Risk Factors include: advanced age (>2mn, >>49women);male gender;dyslipidemia;hypertension     Objective:    Today's Vitals   12/31/22 1435  Weight: 177 lb (80.3 kg)   Body mass index is 24.01 kg/m.     12/31/2022    2:42 PM  Advanced Directives  Does Patient Have a Medical Advance Directive? Yes  Type of AParamedicof AGlen WiltonLiving will  Copy of [place] Chart? No - copy requested    Current Medications (verified) Outpatient Encounter Medications as of 12/31/2022  Medication Sig   Ascorbic Acid (VITAMIN C) 1000 MG tablet Take 1,000 mg by mouth daily.   fluorouracil (EFUDEX) 5 % cream    GLUCOSAMINE-CHONDROIT-VIT C-MN PO 1 PO QD ORAL   hydrochlorothiazide (MICROZIDE) 12.5 MG capsule Take one tablet by mouth daily   lisinopril (ZESTRIL) 10 MG tablet TAKE 1 TABLET(10 MG) BY MOUTH TWICE DAILY   Multiple Vitamin (MULTIVITAMIN ADULT PO) Take by mouth daily.   rosuvastatin (CRESTOR) 5 MG tablet Take 1 tablet (5 mg total) by mouth daily.   sildenafil (VIAGRA) 100 MG tablet take one tablet prior to sexual activity   UNABLE TO FIND Balance of nature  Fruits and vegetables.   No facility-administered encounter medications on file as of 12/31/2022.    Allergies (verified) Patient has no known allergies.   History: History reviewed. No pertinent past medical history. Past Surgical History:  Procedure Laterality Date    CHOLECYSTECTOMY  1998   HERNIA REPAIR  2008   L 1998 R 2004   INCISIONAL HERNIA REPAIR Left 08/02/2022   Family History  Problem Relation Age of Onset   Stroke Mother    Hypertension Mother    Diabetes Mother    Hearing loss Father    Social History   Socioeconomic History   Marital status: Divorced    Spouse name: Not on file   Number of children: 3   Years of education: Not on file   Highest education level: Not on file  Occupational History   Not on file  Tobacco Use   Smoking status: Former   Smokeless tobacco: Former  VScientific laboratory technicianUse: Never used  Substance and Sexual Activity   Alcohol use: Yes    Alcohol/week: 10.0 standard drinks of alcohol    Types: 10 Shots of liquor per week   Drug use: Never   Sexual activity: Not Currently    Birth control/protection: Condom  Other Topics Concern   Not on file  Social History Narrative   ASocial research officer, 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   Social Determinants of Health   Financial Resource Strain: Low Risk  (12/31/2022)   Overall Financial Resource Strain (CARDIA)    Difficulty of Paying Living Expenses: Not hard at all  Food Insecurity: No Food Insecurity (12/31/2022)   Hunger Vital Sign    Worried About Running Out of Food in the Last Year: Never true    Ra[place]an the Last  Year: Never true  Transportation Needs: No Transportation Needs (12/31/2022)   PRAPARE - Hydrologist (Medical): No    Lack of Transportation (Non-Medical): No  Physical Activity: Sufficiently Active (12/31/2022)   Exercise Vital Sign    Days of Exercise per Week: 7 days    Minutes of Exercise per Session: 40 Barry  Stress: No Stress Concern Present (12/31/2022)   [place]    Feeling of Stress : Not at all  Social Connections: Socially Isolated (12/31/2022)   Social Connection and Isolation Panel [NHANES]    Frequency of Communication with Friends and Family:  More than three times a week    Frequency of Social Gatherings with Friends and Family: More than three times a week    Attends Religious Services: Never    Marine scientist or Organizations: No    Attends Music therapist: Never    Marital Status: Divorced    Tobacco Counseling Counseling given: Not Answered   Clinical Intake:  Pre-visit preparation completed: Yes  Pain : No/denies pain     BMI - recorded: 24.01 Nutritional Status: BMI of 19-24  Normal Nutritional Risks: None Diabetes: No  How often do you need to have someone help you when you read instructions, pamphlets, or other written materials from your doctor or pharmacy?: 1 - Never  Diabetic?no  Interpreter Needed?: No  Information entered by :: Charlott Rakes, LPN   Activities of Daily Living    12/31/2022    2:43 PM  In your present state of health, do you have any difficulty performing the following activities:  Hearing? 1  Comment has hearing aids  Vision? 0  Difficulty concentrating or making decisions? 0  Walking or climbing stairs? 0  Dressing or bathing? 0  Doing errands, shopping? 0  Preparing Food and eating ? N  Using the Toilet? N  In the past six months, have you accidently leaked urine? N  Do you have problems with loss of bowel control? N  Managing your Medications? N  Managing your Finances? N  Housekeeping or managing your Housekeeping? N    Patient Care Team: Allwardt, Randa Evens, PA-C as PCP - General (Physician Assistant)  Indicate any recent Medical Services you may have received from other than [facility] providers in the past year (date may be approximate).     Assessment:   This is a routine wellness examination for Barry Tanner.  Hearing/Vision screen Hearing Screening - Comments:: Pt has a hearing aids stated loss of hearing  Vision Screening - Comments:: Pt follows [facility] with [facility] for annual eye exams   Dietary issues and exercise activities  discussed: Current Exercise Habits: Home exercise routine, Type of exercise: Other - see comments (rowing machine), Time (Minutes): 40, Frequency (Times/Week): 7, Weekly Exercise (Minutes/Week): 280   Goals Addressed             This Visit's Progress    Patient Stated       None at this time        Depression Screen    12/31/2022    2:39 PM 08/22/2022    8:25 AM 01/24/2022    8:03 AM 07/20/2020    8:21 AM 07/13/2019    8:21 AM  PHQ 2/9 Scores  PHQ - 2 Score 0 0 0 0 0    Fall Risk    12/31/2022    2:43 PM 08/22/2022    8:25 AM  05/28/2022    8:17 AM 05/23/2021    1:34 PM 07/20/2020    8:22 AM  Fall Risk   Falls in the past year? 0 0 0 0 0  Number falls in past yr: 0 0 0    Injury with Fall? 0 0 0    Risk for fall due to : Impaired vision  No Fall Risks  No Fall Risks  Follow up Falls prevention discussed Falls prevention discussed Falls evaluation completed      FALL RISK PREVENTION PERTAINING TO THE HOME:  Any stairs in or around the home? Yes  If so, are there any without handrails? No  Home free of loose throw rugs in walkways, pet beds, electrical cords, etc? Yes  Adequate lighting in your home to reduce risk of falls? Yes   ASSISTIVE DEVICES UTILIZED TO PREVENT FALLS:  Life alert? No  Use of a cane, walker or w/c? No  Grab bars in the bathroom? No  Shower chair or bench in shower? no Elevated toilet seat or a handicapped toilet? No   TIMED UP AND GO:  Was the test performed? No .   Cognitive Function:        12/31/2022    2:45 PM  6CIT Screen  What Year? 0 points  What month? 0 points  What time? 0 points  Count back from 20 0 points  Months in reverse 0 points  Repeat phrase 2 points  Total Score 2 points    Immunizations Immunization History  Administered Date(s) Administered   Fluad Quad(high Dose 65+) 10/03/2021, 08/22/2022   Hep A / Hep B 09/15/2001, 10/21/2001, 09/21/2004   Hepatitis A, Adult 10/16/1994, 09/15/2001, 10/21/2001   IPV  11/05/1975, 09/06/2008   Influenza Split 05/18/2016   MenQuadfi_Meningococcal Groups ACYW Conjugate 04/16/1994   PFIZER(Purple Top)SARS-COV-2 Vaccination 02/19/2020, 03/11/2020   Pneumococcal Polysaccharide-23 07/20/2020   Rubella 09/06/2008   Td 06/28/1988, 09/15/2001   Tdap 11/05/2012, 05/18/2016   Typhoid Parenteral 09/21/2004   Yellow Fever 10/13/1993   Zoster Recombinat (Shingrix) 06/28/2015   Zoster, Live 06/27/2015    TDAP status: Up to date  Flu Vaccine status: Up to date  Pneumococcal vaccine status: Up to date  Covid-19 vaccine status: Completed vaccines  Qualifies for Shingles Vaccine? Yes   Zostavax completed Yes   Shingrix Completed?: No.    Education has been provided regarding the importance of this vaccine. Patient has been advised to call insurance company to determine out of pocket expense if they have not yet received this vaccine. Advised may also receive vaccine at local pharmacy or Health Dept. Verbalized acceptance and understanding.  Screening Tests Health Maintenance  Topic Date Due   Zoster Vaccines- Shingrix (2 of 2) 08/23/2015   COVID-19 Vaccine (3 - Pfizer risk series) 04/08/2020   Pneumonia Vaccine 47+ Years old (2 - PCV) 01/25/2023 (Originally 07/20/2021)   Medicare Annual Wellness (AWV)  01/01/2024   DTaP/Tdap/Td (5 - Td or Tdap) 05/18/2026   COLONOSCOPY (Pts 45-48yr Insurance coverage will need to be confirmed)  07/25/2028   INFLUENZA VACCINE  Completed   Hepatitis C Screening  Completed   HPV VACCINES  Aged Out    Health Maintenance  Health Maintenance Due  Topic Date Due   Zoster Vaccines- Shingrix (2 of 2) 08/23/2015   COVID-19 Vaccine (3 - Pfizer risk series) 04/08/2020    Colorectal cancer screening: Type of screening: Colonoscopy. Completed 07/25/18. Repeat every 10 years   Additional Screening:  Hepatitis C Screening:  Completed 07/15/19  Vision Screening: Recommended annual ophthalmology exams for early detection of  glaucoma and other disorders of the eye. Is the patient up to date with their annual eye exam?  Yes  Who is the provider or what is the name of the office in which the patient attends annual eye exams? [facility]  If pt is not established with a provider, would they like to be referred to a provider to establish care? No .   Dental Screening: Recommended annual dental exams for proper oral hygiene  Community Resource Referral / Chronic Care Management: CRR required this visit?  No   CCM required this visit?  No      Plan:     I have personally reviewed and noted the following in the patient's chart:   Medical and social history Use of alcohol, tobacco or illicit drugs  Current medications and supplements including opioid prescriptions. Patient is not currently taking opioid prescriptions. Functional ability and status Nutritional status Physical activity Advanced directives List of other physicians Hospitalizations, surgeries, and ER visits in previous 12 months Vitals Screenings to include cognitive, depression, and falls Referrals and appointments  In addition, I have reviewed and discussed with patient certain preventive protocols, quality metrics, and best practice recommendations. A written personalized care plan for preventive services as well as general preventive health recommendations were provided to patient.     Willette Brace, LPN   12/02/6158   Nurse Notes: none

## 2022-12-31 NOTE — Patient Instructions (Signed)
Barry Tanner , Thank you for taking time to come for your Medicare Wellness Visit. I appreciate your ongoing commitment to your health goals. Please review the following plan we discussed and let me know if I can assist you in the future.   These are the goals we discussed:  Goals      Patient Stated     None at this time         This is a list of the screening recommended for you and due dates:  Health Maintenance  Topic Date Due   Zoster (Shingles) Vaccine (2 of 2) 08/23/2015   COVID-19 Vaccine (3 - Pfizer risk series) 04/08/2020   Pneumonia Vaccine (2 - PCV) 01/25/2023*   Medicare Annual Wellness Visit  01/01/2024   DTaP/Tdap/Td vaccine (5 - Td or Tdap) 05/18/2026   Colon Cancer Screening  07/25/2028   Flu Shot  Completed   Hepatitis C Screening: USPSTF Recommendation to screen - Ages 18-79 yo.  Completed   HPV Vaccine  Aged Out  *Topic was postponed. The date shown is not the original due date.    Advanced directives: Please bring a copy of your health care power of attorney and living will to the office at your convenience.  Conditions/risks identified: none at this time   Next appointment: Follow up in one year for your annual wellness visit.   Preventive Care 68 Years and Older, Male  Preventive care refers to lifestyle choices and visits with your health care provider that can promote health and wellness. What does preventive care include? A yearly physical exam. This is also called an annual well check. Dental exams once or twice a year. Routine eye exams. Ask your health care provider how often you should have your eyes checked. Personal lifestyle choices, including: Daily care of your teeth and gums. Regular physical activity. Eating a healthy diet. Avoiding tobacco and drug use. Limiting alcohol use. Practicing safe sex. Taking low doses of aspirin every day. Taking vitamin and mineral supplements as recommended by your health care provider. What happens  during an annual well check? The services and screenings done by your health care provider during your annual well check will depend on your age, overall health, lifestyle risk factors, and family history of disease. Counseling  Your health care provider may ask you questions about your: Alcohol use. Tobacco use. Drug use. Emotional well-being. Home and relationship well-being. Sexual activity. Eating habits. History of falls. Memory and ability to understand (cognition). Work and work Statistician. Screening  You may have the following tests or measurements: Height, weight, and BMI. Blood pressure. Lipid and cholesterol levels. These may be checked every 5 years, or more frequently if you are over 54 years old. Skin check. Lung cancer screening. You may have this screening every year starting at age 20 if you have a 30-pack-year history of smoking and currently smoke or have quit within the past 15 years. Fecal occult blood test (FOBT) of the stool. You may have this test every year starting at age 48. Flexible sigmoidoscopy or colonoscopy. You may have a sigmoidoscopy every 5 years or a colonoscopy every 10 years starting at age 76. Prostate cancer screening. Recommendations will vary depending on your family history and other risks. Hepatitis C blood test. Hepatitis B blood test. Sexually transmitted disease (STD) testing. Diabetes screening. This is done by checking your blood sugar (glucose) after you have not eaten for a while (fasting). You may have this done every 1-3 years. Abdominal  aortic aneurysm (AAA) screening. You may need this if you are a current or former smoker. Osteoporosis. You may be screened starting at age 76 if you are at high risk. Talk with your health care provider about your test results, treatment options, and if necessary, the need for more tests. Vaccines  Your health care provider may recommend certain vaccines, such as: Influenza vaccine. This is  recommended every year. Tetanus, diphtheria, and acellular pertussis (Tdap, Td) vaccine. You may need a Td booster every 10 years. Zoster vaccine. You may need this after age 22. Pneumococcal 13-valent conjugate (PCV13) vaccine. One dose is recommended after age 81. Pneumococcal polysaccharide (PPSV23) vaccine. One dose is recommended after age 57. Talk to your health care provider about which screenings and vaccines you need and how often you need them. This information is not intended to replace advice given to you by your health care provider. Make sure you discuss any questions you have with your health care provider. Document Released: 12/09/2015 Document Revised: 08/01/2016 Document Reviewed: 09/13/2015 Elsevier Interactive Patient Education  2017 [employer] Prevention in the Home Falls can cause injuries. They can happen to people of all ages. There are many things you can do to make your home safe and to help prevent falls. What can I do on the outside of my home? Regularly fix the edges of walkways and driveways and fix any cracks. Remove anything that might make you trip as you walk through a door, such as a raised step or threshold. Trim any bushes or trees on the path to your home. Use bright outdoor lighting. Clear any walking paths of anything that might make someone trip, such as rocks or tools. Regularly check to see if handrails are loose or broken. Make sure that both sides of any steps have handrails. Any raised decks and porches should have guardrails on the edges. Have any leaves, snow, or ice cleared regularly. Use sand or salt on walking paths during winter. Clean up any spills in your garage right away. This includes oil or grease spills. What can I do in the bathroom? Use night lights. Install grab bars by the toilet and in the tub and shower. Do not use towel bars as grab bars. Use non-skid mats or decals in the tub or shower. If you need to sit down in  the shower, use a plastic, non-slip stool. Keep the floor dry. Clean up any water that spills on the floor as soon as it happens. Remove soap buildup in the tub or shower regularly. Attach bath mats securely with double-sided non-slip rug tape. Do not have throw rugs and other things on the floor that can make you trip. What can I do in the bedroom? Use night lights. Make sure that you have a light by your bed that is easy to reach. Do not use any sheets or blankets that are too big for your bed. They should not hang down onto the floor. Have a firm chair that has side arms. You can use this for support while you get dressed. Do not have throw rugs and other things on the floor that can make you trip. What can I do in the kitchen? Clean up any spills right away. Avoid walking on wet floors. Keep items that you use a lot in easy-to-reach places. If you need to reach something above you, use a strong step stool that has a grab bar. Keep electrical cords out of the way. Do not use  floor polish or wax that makes floors slippery. If you must use wax, use non-skid floor wax. Do not have throw rugs and other things on the floor that can make you trip. What can I do with my stairs? Do not leave any items on the stairs. Make sure that there are handrails on both sides of the stairs and use them. Fix handrails that are broken or loose. Make sure that handrails are as long as the stairways. Check any carpeting to make sure that it is firmly attached to the stairs. Fix any carpet that is loose or worn. Avoid having throw rugs at the top or bottom of the stairs. If you do have throw rugs, attach them to the floor with carpet tape. Make sure that you have a light switch at the top of the stairs and the bottom of the stairs. If you do not have them, ask someone to add them for you. What else can I do to help prevent falls? Wear shoes that: Do not have high heels. Have rubber bottoms. Are comfortable  and fit you well. Are closed at the toe. Do not wear sandals. If you use a stepladder: Make sure that it is fully opened. Do not climb a closed stepladder. Make sure that both sides of the stepladder are locked into place. Ask someone to hold it for you, if possible. Clearly mark and make sure that you can see: Any grab bars or handrails. First and last steps. Where the edge of each step is. Use tools that help you move around (mobility aids) if they are needed. These include: Canes. Walkers. Scooters. Crutches. Turn on the lights when you go into a dark area. Replace any light bulbs as soon as they burn out. Set up your furniture so you have a clear path. Avoid moving your furniture around. If any of your floors are uneven, fix them. If there are any pets around you, be aware of where they are. Review your medicines with your doctor. Some medicines can make you feel dizzy. This can increase your chance of falling. Ask your doctor what other things that you can do to help prevent falls. This information is not intended to replace advice given to you by your health care provider. Make sure you discuss any questions you have with your health care provider. Document Released: 09/08/2009 Document Revised: 04/19/2016 Document Reviewed: 12/17/2014 Elsevier Interactive Patient Education  2017 [employer].

## 2023-01-11 ENCOUNTER — Other Ambulatory Visit: Payer: Self-pay

## 2023-01-11 ENCOUNTER — Emergency Department (HOSPITAL_BASED_OUTPATIENT_CLINIC_OR_DEPARTMENT_OTHER)
Admission: EM | Admit: 2023-01-11 | Discharge: 2023-01-11 | Disposition: A | Discharge status: Home / Self Care | Payer: Medicare Other | Attending: Emergency Medicine | Admitting: Emergency Medicine

## 2023-01-11 ENCOUNTER — Encounter (HOSPITAL_BASED_OUTPATIENT_CLINIC_OR_DEPARTMENT_OTHER): Payer: Self-pay | Admitting: *Deleted

## 2023-01-11 DIAGNOSIS — I10 Essential (primary) hypertension: Secondary | ICD-10-CM | POA: Diagnosis not present

## 2023-01-11 DIAGNOSIS — Z79899 Other long term (current) drug therapy: Secondary | ICD-10-CM | POA: Insufficient documentation

## 2023-01-11 DIAGNOSIS — T783XXA Angioneurotic edema, initial encounter: Secondary | ICD-10-CM | POA: Diagnosis not present

## 2023-01-11 DIAGNOSIS — R220 Localized swelling, mass and lump, head: Secondary | ICD-10-CM | POA: Diagnosis present

## 2023-01-11 LAB — CBC
HCT: 41.3 % (ref 39.0–52.0)
Hemoglobin: 14.6 g/dL (ref 13.0–17.0)
MCH: 31.7 pg (ref 26.0–34.0)
MCHC: 35.4 g/dL (ref 30.0–36.0)
MCV: 89.8 fL (ref 80.0–100.0)
Platelets: 200 10*3/uL (ref 150–400)
RBC: 4.6 MIL/uL (ref 4.22–5.81)
RDW: 11.7 % (ref 11.5–15.5)
WBC: 8.2 10*3/uL (ref 4.0–10.5)
nRBC: 0 % (ref 0.0–0.2)

## 2023-01-11 LAB — BASIC METABOLIC PANEL
Anion gap: 9 (ref 5–15)
BUN: 19 mg/dL (ref 8–23)
CO2: 28 mmol/L (ref 22–32)
Calcium: 9.6 mg/dL (ref 8.9–10.3)
Chloride: 103 mmol/L (ref 98–111)
Creatinine, Ser: 0.99 mg/dL (ref 0.61–1.24)
GFR, Estimated: >60 mL/min (ref >60)
Glucose, Bld: 105 mg/dL — ABNORMAL HIGH (ref 70–99)
Potassium: 3.7 mmol/L (ref 3.5–5.1)
Sodium: 140 mmol/L (ref 135–145)

## 2023-01-11 MED ORDER — DIPHENHYDRAMINE HCL 50 MG/ML IJ SOLN
25.0000 mg | Freq: Once | INTRAMUSCULAR | Status: AC
  Administered 2023-01-11: 25 mg via INTRAVENOUS
  Filled 2023-01-11: qty 1

## 2023-01-11 MED ORDER — DIPHENHYDRAMINE HCL 25 MG PO TABS: 25.0000 mg | ORAL_TABLET | Freq: Four times a day (QID) | ORAL | 0 refills | Status: AC | PRN

## 2023-01-11 MED ORDER — DEXAMETHASONE SODIUM PHOSPHATE 10 MG/ML IJ SOLN
10.0000 mg | Freq: Once | INTRAMUSCULAR | Status: AC
  Administered 2023-01-11: 10 mg via INTRAVENOUS
  Filled 2023-01-11: qty 1

## 2023-01-11 MED ORDER — FAMOTIDINE IN NACL 20-0.9 MG/50ML-% IV SOLN
20.0000 mg | Freq: Once | INTRAVENOUS | Status: AC
  Administered 2023-01-11: 20 mg via INTRAVENOUS
  Filled 2023-01-11: qty 50

## 2023-01-11 MED ORDER — AMLODIPINE BESYLATE 5 MG PO TABS: 5.0000 mg | ORAL_TABLET | Freq: Every day | ORAL | 1 refills | Status: DC

## 2023-01-11 MED ORDER — PREDNISONE 50 MG PO TABS: 50.0000 mg | ORAL_TABLET | Freq: Every day | ORAL | 0 refills | Status: DC

## 2023-01-11 NOTE — ED Provider Notes (Signed)
[facility] Provider Note   CSN: [id number] Arrival date & time: 01/11/23  2027     History  Chief Complaint  Patient presents with   Facial Swelling    Barry Tanner is a 67 y.o. male.  HPI   Patient has a history of hypertension hyperlipidemia who presents to the ED with complaints of lip swelling.  Patient states he noticed the symptoms about 5 PM this evening.  Patient has been on lisinopril for a number of years but has never had any issues.  Patient denies any new drugs.  No bee stings.  He has never had this reaction before.  He has noticed significant swelling in his lip but nothing involving his tongue.  He is not having any difficulty breathing.  No rash elsewhere  Home Medications Prior to Admission medications   Medication Sig Start Date End Date Taking? Authorizing Provider  amLODipine (NORVASC) 5 MG tablet Take 1 tablet (5 mg total) by mouth daily. 01/11/23  Yes Dorie Rank, MD  diphenhydrAMINE (BENADRYL) 25 MG tablet Take 1 tablet (25 mg total) by mouth every 6 (six) hours as needed. 01/11/23  Yes Dorie Rank, MD  predniSONE (DELTASONE) 50 MG tablet Take 1 tablet (50 mg total) by mouth daily. 01/11/23  Yes Dorie Rank, MD  Ascorbic Acid (VITAMIN C) 1000 MG tablet Take 1,000 mg by mouth daily.    [provider]  fluorouracil (EFUDEX) 5 % cream  10/19/20   [provider]  GLUCOSAMINE-CHONDROIT-VIT C-MN PO 1 PO QD ORAL 11/05/12   [provider]  hydrochlorothiazide (MICROZIDE) 12.5 MG capsule Take one tablet by mouth daily 01/24/22   Allwardt, Alyssa M, PA-C  Multiple Vitamin (MULTIVITAMIN ADULT PO) Take by mouth daily.    [provider]  rosuvastatin (CRESTOR) 5 MG tablet Take 1 tablet (5 mg total) by mouth daily. 01/24/22   Allwardt, Randa Evens, PA-C  sildenafil (VIAGRA) 100 MG tablet take one tablet prior to sexual activity 01/20/21   Orma Flaming, MD  UNABLE TO FIND Balance of nature  Fruits  and vegetables.    [provider]      Allergies    Patient has no known allergies.    Review of Systems   Review of Systems  Physical Exam Updated Vital Signs BP (!) 134/94   Pulse 66   Temp 98.4 F (36.9 C) (Oral)   Resp 16   SpO2 96%  Physical Exam Vitals and nursing note reviewed.  Constitutional:      General: He is not in acute distress.    Appearance: He is well-developed.  HENT:     Head: Normocephalic and atraumatic.     Right Ear: External ear normal.     Left Ear: External ear normal.     Mouth/Throat:     Mouth: Angioedema present. No injury or lacerations.  Eyes:     General: No scleral icterus.       Right eye: No discharge.        Left eye: No discharge.     Conjunctiva/sclera: Conjunctivae normal.  Neck:     Trachea: No tracheal deviation.  Cardiovascular:     Rate and Rhythm: Normal rate and regular rhythm.  Pulmonary:     Effort: Pulmonary effort is normal. No respiratory distress.     Breath sounds: Normal breath sounds. No stridor. No wheezing or rales.  Abdominal:     General: Bowel sounds are normal. There is no  distension.     Palpations: Abdomen is soft.     Tenderness: There is no abdominal tenderness. There is no guarding or rebound.  Musculoskeletal:        General: No tenderness or deformity.     Cervical back: Neck supple.  Skin:    General: Skin is warm and dry.     Findings: No rash.  Neurological:     General: No focal deficit present.     Mental Status: He is alert.     Cranial Nerves: No cranial nerve deficit, dysarthria or facial asymmetry.     Sensory: No sensory deficit.     Motor: No abnormal muscle tone or seizure activity.     Coordination: Coordination normal.  Psychiatric:        Mood and Affect: Mood normal.     ED Results / Procedures / Treatments   Labs (all labs ordered are listed, but only abnormal results are displayed) Labs Reviewed  BASIC METABOLIC PANEL - Abnormal; Notable for the following  components:      Result Value   Glucose, Bld 105 (*)    All other components within normal limits  CBC    EKG None  Radiology No results found.  Procedures Procedures    Medications Ordered in ED Medications  diphenhydrAMINE (BENADRYL) injection 25 mg (25 mg Intravenous Given 01/11/23 2234)  dexamethasone (DECADRON) injection 10 mg (10 mg Intravenous Given 01/11/23 2234)  famotidine (PEPCID) IVPB 20 mg premix (0 mg Intravenous Stopped 01/11/23 2237)    ED Course/ Medical Decision Making/ A&P Clinical Course as of 01/11/23 2340  Fri Jan 11, 2023  [id number] Basic metabolic panel(!) Metabolic panel normal CBC normal [JK]    Clinical Course User Index [JK] Dorie Rank, MD                             Medical Decision Making Problems Addressed: Angioedema, initial encounter: acute illness or injury that poses a threat to life or bodily functions  Amount and/or Complexity of Data Reviewed Labs: ordered. Decision-making details documented in ED Course.  Risk OTC drugs. Prescription drug management.   Patient presented to the ED for evaluation of angioedema involving his upper lip.No movement of theThe lower lip or his tongue.  Patient's labs without any acute abnormality.  Patient was treated with antihistamines and steroids without any significant improvement.  He continues to have upper lip edema but again no involvement of his tongue or lower lip.  Patient's not having any respiratory difficulty and no signs of systemic allergic reaction.  Suspect this is related to his ACE inhibitor.  Will have him discontinue that.  I will have him try a few days of antihistamines and steroids in case this could be something other than his ACE inhibitor.  Will also have him stop the ACE inhibitor and start Norvasc instead.        Final Clinical Impression(s) / ED Diagnoses Final diagnoses:  Angioedema, initial encounter    Rx / DC Orders ED Discharge Orders          Ordered     predniSONE (DELTASONE) 50 MG tablet  Daily        01/11/23 2339    diphenhydrAMINE (BENADRYL) 25 MG tablet  Every 6 hours PRN        01/11/23 2339    amLODipine (NORVASC) 5 MG tablet  Daily        01/11/23 2339  Dorie Rank, MD 01/11/23 [phone]

## 2023-01-11 NOTE — Discharge Instructions (Signed)
Take the medications as prescribedTo help with your lip swelling.Stop taking lisinopril and start taking the Norvasc instead for your blood pressure.  Follow-up with your doctor next week to be rechecked.  Return to the ED for worsening symptoms

## 2023-01-11 NOTE — ED Triage Notes (Signed)
Pt here with swelling of upper lip which is significant and began today around 5pm. Pt is on lisinopril and has been taking this medication for over 5 years.  Pt takes this every evening, has not taken todays dose.  No known allergies.  No swelling of tongue, no sob.

## 2023-01-14 ENCOUNTER — Encounter: Payer: Self-pay | Admitting: Physician Assistant

## 2023-01-15 NOTE — Telephone Encounter (Signed)
Please see pt response and advise

## 2023-01-22 ENCOUNTER — Ambulatory Visit (INDEPENDENT_AMBULATORY_CARE_PROVIDER_SITE_OTHER): Payer: Medicare Other | Admitting: Physician Assistant

## 2023-01-22 ENCOUNTER — Encounter: Payer: Self-pay | Admitting: Physician Assistant

## 2023-01-22 VITALS — BP 146/80 | HR 84 | Temp 98.2°F | Ht 72.0 in | Wt 174.0 lb

## 2023-01-22 DIAGNOSIS — I10 Essential (primary) hypertension: Secondary | ICD-10-CM | POA: Diagnosis not present

## 2023-01-22 DIAGNOSIS — T783XXA Angioneurotic edema, initial encounter: Secondary | ICD-10-CM

## 2023-01-22 NOTE — Progress Notes (Signed)
Subjective:    Patient ID: Barry Tanner, male    DOB: 1956/08/12, 67 y.o.   MRN: [id number]  Chief Complaint  Patient presents with   Follow-up    Pt in office for ED follow up; pt had swelling in top lip only, had no other symptoms and still unsure what caused swelling;     HPI Patient is in today for ED follow-up from 01/11/23. Dx angioedema (upper lip). He was taken off lisinopril and started on amlodipine 5 mg daily (taking this at night). Started an exercise routine in the last two months - enjoying rowing machine. Cutting out chewing tobacco this week. Working on nutrition as well.   Feeling fine since ED visit. No more swelling. No SOB or CP. No headaches or dizziness. No other symptoms.    Past Medical History:  Diagnosis Date   Benign essential HTN 07/13/2019   Mixed hyperlipidemia 07/15/2019    Past Surgical History:  Procedure Laterality Date   CHOLECYSTECTOMY  1998   HERNIA REPAIR  2008   L 1998 R 2004   INCISIONAL HERNIA REPAIR Left 08/02/2022    Family History  Problem Relation Age of Onset   Stroke Mother    Hypertension Mother    Diabetes Mother    Hearing loss Father     Social History   Tobacco Use   Smoking status: Former   Smokeless tobacco: Former  Scientific laboratory technician Use: Never used  Substance Use Topics   Alcohol use: Yes    Alcohol/week: 10.0 standard drinks of alcohol    Types: 10 Shots of liquor per week   Drug use: Never     Allergies  Allergen Reactions   Lisinopril Swelling    Review of Systems NEGATIVE UNLESS OTHERWISE INDICATED IN HPI      Objective:     BP (!) 146/80 (BP Location: Left Arm)   Pulse 84   Temp 98.2 F (36.8 C) (Temporal)   Ht 6' (1.829 m)   Wt 174 lb (78.9 kg)   SpO2 96%   BMI 23.60 kg/m   Wt Readings from Last 3 Encounters:  01/22/23 174 lb (78.9 kg)  12/31/22 177 lb (80.3 kg)  08/22/22 177 lb 3.2 oz (80.4 kg)    BP Readings from Last 3 Encounters:  01/22/23 (!) 146/80  01/11/23  (!) 140/79  08/22/22 126/84     Physical Exam Constitutional:      Appearance: Normal appearance.  Eyes:     Extraocular Movements: Extraocular movements intact.     Conjunctiva/sclera: Conjunctivae normal.     Pupils: Pupils are equal, round, and reactive to light.  Cardiovascular:     Rate and Rhythm: Normal rate and regular rhythm.  Pulmonary:     Effort: Pulmonary effort is normal.     Breath sounds: Normal breath sounds.  Musculoskeletal:     Right lower leg: No edema.     Left lower leg: No edema.  Neurological:     General: No focal deficit present.     Mental Status: He is alert and oriented to person, place, and time.  Psychiatric:        Mood and Affect: Mood normal.        Behavior: Behavior normal.        Assessment & Plan:  Benign essential HTN  Angioedema, initial encounter    I personally reviewed patient's recent ED notes & labs. Angioedema likely 2/2 ACEi lisinopril. This was discontinued. He will  continue amlodipine 5 mg at this time. BP slightly elevated in office today. Readings from home showing high 120s-130s/80s. Continue to monitor & keep up good work with lifestyle. He will send me readings through MyChart in a few weeks, modify meds at that time if needed.    F/up for annual CPE in September.     Macklen Wilhoite M Shanedra Lave, PA-C

## 2023-02-08 ENCOUNTER — Encounter: Payer: Self-pay | Admitting: Physician Assistant

## 2023-02-08 ENCOUNTER — Other Ambulatory Visit: Payer: Self-pay

## 2023-02-08 MED ORDER — HYDROCHLOROTHIAZIDE 12.5 MG PO CAPS: ORAL_CAPSULE | ORAL | 3 refills | Status: DC

## 2023-03-23 ENCOUNTER — Other Ambulatory Visit: Payer: Self-pay | Admitting: Physician Assistant

## 2023-03-25 ENCOUNTER — Encounter: Payer: Self-pay | Admitting: Physician Assistant

## 2023-03-25 ENCOUNTER — Other Ambulatory Visit: Payer: Self-pay

## 2023-03-25 MED ORDER — AMLODIPINE BESYLATE 5 MG PO TABS: 5.0000 mg | ORAL_TABLET | Freq: Every day | ORAL | 3 refills | Status: DC

## 2023-03-25 NOTE — Telephone Encounter (Signed)
Sent in refill for Statin medication, ok to refill Amlodipine previously filled by another provider?

## 2023-05-08 ENCOUNTER — Ambulatory Visit (INDEPENDENT_AMBULATORY_CARE_PROVIDER_SITE_OTHER): Payer: Medicare Other | Admitting: Physician Assistant

## 2023-05-08 ENCOUNTER — Encounter: Payer: Self-pay | Admitting: Physician Assistant

## 2023-05-08 VITALS — BP 138/80 | HR 70 | Temp 98.0°F | Ht 72.0 in | Wt 172.8 lb

## 2023-05-08 DIAGNOSIS — M545 Low back pain, unspecified: Secondary | ICD-10-CM

## 2023-05-08 DIAGNOSIS — R1031 Right lower quadrant pain: Secondary | ICD-10-CM

## 2023-05-08 LAB — COMPREHENSIVE METABOLIC PANEL
ALT: 17 U/L (ref 0–53)
AST: 20 U/L (ref 0–37)
Albumin: 4.4 g/dL (ref 3.5–5.2)
Alkaline Phosphatase: 92 U/L (ref 39–117)
BUN: 13 mg/dL (ref 6–23)
CO2: 32 mEq/L (ref 19–32)
Calcium: 9.4 mg/dL (ref 8.4–10.5)
Chloride: 102 mEq/L (ref 96–112)
Creatinine, Ser: 1.1 mg/dL (ref 0.40–1.50)
GFR: 69.21 mL/min (ref >60.00)
Glucose, Bld: 92 mg/dL (ref 70–99)
Potassium: 3.8 mEq/L (ref 3.5–5.1)
Sodium: 141 mEq/L (ref 135–145)
Total Bilirubin: 0.5 mg/dL (ref 0.2–1.2)
Total Protein: 6.9 g/dL (ref 6.0–8.3)

## 2023-05-08 LAB — CBC WITH DIFFERENTIAL/PLATELET
Basophils Absolute: 0 10*3/uL (ref 0.0–0.1)
Basophils Relative: 0.5 % (ref 0.0–3.0)
Eosinophils Absolute: 0.1 10*3/uL (ref 0.0–0.7)
Eosinophils Relative: 0.7 % (ref 0.0–5.0)
HCT: 45 % (ref 39.0–52.0)
Hemoglobin: 15 g/dL (ref 13.0–17.0)
Lymphocytes Relative: 27.6 % (ref 12.0–46.0)
Lymphs Abs: 2.1 10*3/uL (ref 0.7–4.0)
MCHC: 33.4 g/dL (ref 30.0–36.0)
MCV: 91.9 fl (ref 78.0–100.0)
Monocytes Absolute: 0.7 10*3/uL (ref 0.1–1.0)
Monocytes Relative: 9.2 % (ref 3.0–12.0)
Neutro Abs: 4.6 10*3/uL (ref 1.4–7.7)
Neutrophils Relative %: 62 % (ref 43.0–77.0)
Platelets: 198 10*3/uL (ref 150.0–400.0)
RBC: 4.89 Mil/uL (ref 4.22–5.81)
RDW: 12.3 % (ref 11.5–15.5)
WBC: 7.5 10*3/uL (ref 4.0–10.5)

## 2023-05-08 LAB — POC URINALSYSI DIPSTICK (AUTOMATED)
Bilirubin, UA: NEGATIVE
Blood, UA: NEGATIVE
Glucose, UA: NEGATIVE
Ketones, UA: NEGATIVE
Leukocytes, UA: NEGATIVE
Nitrite, UA: NEGATIVE
Protein, UA: NEGATIVE
Spec Grav, UA: 1.025 (ref 1.010–1.025)
Urobilinogen, UA: 0.2 E.U./dL
pH, UA: 6 (ref 5.0–8.0)

## 2023-05-08 NOTE — Progress Notes (Signed)
Subjective:    Patient ID: Barry Tanner, male    DOB: [birth year], 67 y.o.   MRN: [id number]  Chief Complaint  Patient presents with   Back Pain    Pt in office for lower back pain on right side and radiates to lower right abdomen. No hx of kidney stones, no issues urinating,     HPI Patient is in today for right lower back pain x 4 weeks, radiates to the front now.   Most of the time having pain, only if laying on floor and elevating legs it feels better. Not waking him up at night.  Aching type pain. Not noticing anything that makes it worse, but progressively has been worsening.  Today, pain is about 8/10 with palpation, but 5/10 just sitting.   No known injury. No heavy lifting. No new exercises. Quit rowing machine x 4 weeks now because of pain.   Normal urination and bowel movements.  Heating pad, but no other tx's tried.  No hx back issues.    Past Medical History:  Diagnosis Date   Benign essential HTN 07/13/2019   Mixed hyperlipidemia 07/15/2019    Past Surgical History:  Procedure Laterality Date   CHOLECYSTECTOMY  1998   HERNIA REPAIR  2008   L 1998 R 2004   INCISIONAL HERNIA REPAIR Left 08/02/2022    Family History  Problem Relation Age of Onset   Stroke Mother    Hypertension Mother    Diabetes Mother    Hearing loss Father     Social History   Tobacco Use   Smoking status: Former   Smokeless tobacco: Former  Building services engineer Use: Never used  Substance Use Topics   Alcohol use: Yes    Alcohol/week: 10.0 standard drinks of alcohol    Types: 10 Shots of liquor per week   Drug use: Never     Allergies  Allergen Reactions   Lisinopril Swelling    Review of Systems NEGATIVE UNLESS OTHERWISE INDICATED IN HPI      Objective:     BP 138/80 (BP Location: Right Arm)   Pulse 70   Temp 98 F (36.7 C) (Temporal)   Ht 6' (1.829 m)   Wt 172 lb 12.8 oz (78.4 kg)   SpO2 96%   BMI 23.44 kg/m   Wt Readings from Last 3 Encounters:   05/08/23 172 lb 12.8 oz (78.4 kg)  01/22/23 174 lb (78.9 kg)  12/31/22 177 lb (80.3 kg)    BP Readings from Last 3 Encounters:  05/08/23 138/80  01/22/23 (!) 146/80  01/11/23 (!) 140/79     Physical Exam Vitals and nursing note reviewed.  Constitutional:      General: He is not in acute distress.    Appearance: Normal appearance. He is not toxic-appearing.  HENT:     Head: Normocephalic and atraumatic.     Right Ear: External ear normal.     Left Ear: External ear normal.     Nose: Nose normal.     Mouth/Throat:     Mouth: Mucous membranes are moist.     Pharynx: Oropharynx is clear.  Eyes:     Extraocular Movements: Extraocular movements intact.     Conjunctiva/sclera: Conjunctivae normal.     Pupils: Pupils are equal, round, and reactive to light.  Cardiovascular:     Rate and Rhythm: Normal rate and regular rhythm.     Pulses: Normal pulses.     Heart sounds: Normal heart  sounds.  Pulmonary:     Effort: Pulmonary effort is normal.     Breath sounds: Normal breath sounds.  Abdominal:     General: Abdomen is flat. Bowel sounds are normal.     Palpations: Abdomen is soft.     Tenderness: There is abdominal tenderness (RLQ). There is no right CVA tenderness, guarding or rebound.  Musculoskeletal:     Cervical back: Normal range of motion and neck supple.  Skin:    General: Skin is warm and dry.  Neurological:     General: No focal deficit present.     Mental Status: He is alert and oriented to person, place, and time.  Psychiatric:        Mood and Affect: Mood normal.        Behavior: Behavior normal.        Assessment & Plan:  Right lower quadrant pain -     POCT Urinalysis Dipstick (Automated) -     CBC with Differential/Platelet -     Comprehensive metabolic panel  Right lower quadrant abdominal pain -     CT ABDOMEN PELVIS W CONTRAST; Future  Acute right-sided low back pain without sciatica   Surprised by the tenderness in the RLQ of abdomen, but  not an acute abdomen, doesn't need ER at this time. Vitals stable. Overall well-appearing, just tender and complaining of pain. UA was normal. Will check labs and may need to plan for imaging because of location of pain. Ddx includes appendicitis, constipation, colitis, UTI, right lumbar strain with referred pain - not an exhaustive list. Pending labs and imaging, will help determine treatment plan. If red flags, pt knows to present to ER.     Return if symptoms worsen or fail to improve.    Edge Mauger M Bianney Rockwood, PA-C

## 2023-05-08 NOTE — Patient Instructions (Signed)
Urine, labs today  May need CT screening pending labs, will hold on scheduling at this time

## 2023-05-09 ENCOUNTER — Ambulatory Visit (HOSPITAL_BASED_OUTPATIENT_CLINIC_OR_DEPARTMENT_OTHER)
Admission: RE | Admit: 2023-05-09 | Discharge: 2023-05-09 | Disposition: A | Discharge status: Home / Self Care | Payer: Medicare Other | Source: Ambulatory Visit | Attending: Physician Assistant | Admitting: Physician Assistant

## 2023-05-09 ENCOUNTER — Other Ambulatory Visit: Payer: Self-pay | Admitting: Physician Assistant

## 2023-05-09 ENCOUNTER — Encounter: Payer: Self-pay | Admitting: Physician Assistant

## 2023-05-09 DIAGNOSIS — R1031 Right lower quadrant pain: Secondary | ICD-10-CM | POA: Diagnosis present

## 2023-05-09 MED ORDER — IOHEXOL 300 MG/ML  SOLN
100.0000 mL | Freq: Once | INTRAMUSCULAR | Status: AC | PRN
  Administered 2023-05-09: 100 mL via INTRAVENOUS

## 2023-05-09 MED ORDER — PREDNISONE 50 MG PO TABS: ORAL_TABLET | ORAL | 0 refills | Status: DC

## 2023-05-09 NOTE — Telephone Encounter (Signed)
Returned pt message and called to advise and confirm address, phone number, nothing to eat or drink after noon. Pt is aware what time to be at imaging center to drink contrast. Pt verbalized understanding

## 2023-08-14 ENCOUNTER — Telehealth: Payer: Medicare Other | Admitting: Physician Assistant

## 2023-08-14 DIAGNOSIS — U071 COVID-19: Secondary | ICD-10-CM | POA: Diagnosis not present

## 2023-08-14 MED ORDER — BENZONATATE 100 MG PO CAPS: 100.0000 mg | ORAL_CAPSULE | Freq: Three times a day (TID) | ORAL | 0 refills | Status: DC | PRN

## 2023-08-14 MED ORDER — NIRMATRELVIR/RITONAVIR (PAXLOVID)TABLET: 3.0000 | ORAL_TABLET | Freq: Two times a day (BID) | ORAL | 0 refills | Status: AC

## 2023-08-14 NOTE — Patient Instructions (Addendum)
[name], thank you for joining [name], PA-C for today's virtual visit.  While this provider is not your primary care provider (PCP), if your PCP is located in our provider database this encounter information will be shared with them immediately following your visit.   A [facility] MyChart account gives you access to today's visit and all your visits, tests, and labs performed at [facility] " click here if you don't have a [facility] MyChart account or go to [email or web address]  Consent: (Patient) Barry Tanner provided verbal consent for this virtual visit at the beginning of the encounter.  Current Medications:  Current Outpatient Medications:    amLODipine (NORVASC) 5 MG tablet, Take 1 tablet (5 mg total) by mouth daily., Disp: 90 tablet, Rfl: 3   Ascorbic Acid (VITAMIN C) 1000 MG tablet, Take 1,000 mg by mouth daily., Disp: , Rfl:    diphenhydrAMINE (BENADRYL) 25 MG tablet, Take 1 tablet (25 mg total) by mouth every 6 (six) hours as needed., Disp: 30 tablet, Rfl: 0   GLUCOSAMINE-CHONDROIT-VIT C-MN PO, 1 PO QD ORAL, Disp: , Rfl:    hydrochlorothiazide (MICROZIDE) 12.5 MG capsule, Take one tablet by mouth daily, Disp: 90 capsule, Rfl: 3   Multiple Vitamin (MULTIVITAMIN ADULT PO), Take by mouth daily., Disp: , Rfl:    predniSONE (DELTASONE) 50 MG tablet, Take 1 tablet (50 mg total) by mouth daily., Disp: 5 tablet, Rfl: 0   predniSONE (DELTASONE) 50 MG tablet, Take one tablet po qd x 5 days with food., Disp: 5 tablet, Rfl: 0   rosuvastatin (CRESTOR) 5 MG tablet, TAKE 1 TABLET(5 MG) BY MOUTH DAILY, Disp: 90 tablet, Rfl: 3   sildenafil (VIAGRA) 100 MG tablet, take one tablet prior to sexual activity, Disp: 10 tablet, Rfl: 2   UNABLE TO FIND, Balance of nature  Fruits and vegetables., Disp: , Rfl:    Medications ordered in this encounter:  No orders of the defined types were placed in this encounter.    *If you need refills on other medications  prior to your next appointment, please contact your pharmacy*  Follow-Up: Call back or seek an in-person evaluation if the symptoms worsen or if the condition fails to improve as anticipated.  [facility] [phone]  Care Instructions: Please keep well-hydrated and get plenty of rest. Start a saline nasal rinse to flush out your nasal passages. You can use plain Mucinex to help thin congestion. Use the Tessalon as directed for cough If you have a humidifier, running in the bedroom at night. I want you to start OTC vitamin D3 1000 units daily, vitamin C 1000 mg daily, and a zinc supplement. Stay off of the rosuvastatin until you have finished your Paxlovid. Please take prescribed medications as directed.    Isolation Instructions: You are to isolate at home until you have been fever free for at least 24 hours without a fever-reducing medication, and symptoms have been steadily improving for 24 hours. At that time,  you can end isolation but need to mask for an additional 5 days.   If you must be around other household members who do not have symptoms, you need to make sure that both you and the family members are masking consistently with a high-quality mask.  If you note any worsening of symptoms despite treatment, please seek an in-person evaluation ASAP. If you note any significant shortness of breath or any chest pain, please seek ER evaluation. Please do not delay care!  COVID-19: What to Do if You Are Sick If you test positive and are an older adult or someone who is at high risk of getting very sick from COVID-19, treatment may be available. Contact a healthcare provider right away after a positive test to determine if you are eligible, even if your symptoms are mild right now. You can also visit a Test to Treat location and, if eligible, receive a prescription from a provider. Don't delay: Treatment must be started within the first few days to be effective. If  you have a fever, cough, or other symptoms, you might have COVID-19. Most people have mild illness and are able to recover at home. If you are sick: Keep track of your symptoms. If you have an emergency warning sign (including trouble breathing), call 911. Steps to help prevent the spread of COVID-19 if you are sick If you are sick with COVID-19 or think you might have COVID-19, follow the steps below to care for yourself and to help protect other people in your home and community. Stay home except to get medical care Stay home. Most people with COVID-19 have mild illness and can recover at home without medical care. Do not leave your home, except to get medical care. Do not visit public areas and do not go to places where you are unable to wear a mask. Take care of yourself. Get rest and stay hydrated. Take over-the-counter medicines, such as acetaminophen, to help you feel better. Stay in touch with your doctor. Call before you get medical care. Be sure to get care if you have trouble breathing, or have any other emergency warning signs, or if you think it is an emergency. Avoid public transportation, ride-sharing, or taxis if possible. Get tested If you have symptoms of COVID-19, get tested. While waiting for test results, stay away from others, including staying apart from those living in your household. Get tested as soon as possible after your symptoms start. Treatments may be available for people with COVID-19 who are at risk for becoming very sick. Don't delay: Treatment must be started early to be effective--some treatments must begin within 5 days of your first symptoms. Contact your healthcare provider right away if your test result is positive to determine if you are eligible. Self-tests are one of several options for testing for the virus that causes COVID-19 and may be more convenient than laboratory-based tests and point-of-care tests. Ask your healthcare provider or your local health  department if you need help interpreting your test results. You can visit your state, tribal, local, and territorial health department's website to look for the latest local information on testing sites. Separate yourself from other people As much as possible, stay in a specific room and away from other people and pets in your home. If possible, you should use a separate bathroom. If you need to be around other people or animals in or outside of the home, wear a well-fitting mask. Tell your close contacts that they may have been exposed to COVID-19. An infected person can spread COVID-19 starting 48 hours (or 2 days) before the person has any symptoms or tests positive. By letting your close contacts know they may have been exposed to COVID-19, you are helping to protect everyone. See COVID-19 and Animals if you have questions about pets. If you are diagnosed with COVID-19, someone from the health department may call you. Answer the call to slow the spread. Monitor your symptoms Symptoms of COVID-19 include fever, cough, or  other symptoms. Follow care instructions from your healthcare provider and local health department. Your local health authorities may give instructions on checking your symptoms and reporting information. When to seek emergency medical attention Look for emergency warning signs* for COVID-19. If someone is showing any of these signs, seek emergency medical care immediately: Trouble breathing Persistent pain or pressure in the chest New confusion Inability to wake or stay awake Pale, gray, or blue-colored skin, lips, or nail beds, depending on skin tone *This list is not all possible symptoms. Please call your medical provider for any other symptoms that are severe or concerning to you. Call 911 or call ahead to your local emergency facility: Notify the operator that you are seeking care for someone who has or may have COVID-19. Call ahead before visiting your doctor Call  ahead. Many medical visits for routine care are being postponed or done by phone or telemedicine. If you have a medical appointment that cannot be postponed, call your doctor's office, and tell them you have or may have COVID-19. This will help the office protect themselves and other patients. If you are sick, wear a well-fitting mask You should wear a mask if you must be around other people or animals, including pets (even at home). Wear a mask with the best fit, protection, and comfort for you. You don't need to wear the mask if you are alone. If you can't put on a mask (because of trouble breathing, for example), cover your coughs and sneezes in some other way. Try to stay at least 6 feet away from other people. This will help protect the people around you. Masks should not be placed on young children under age 65 years, anyone who has trouble breathing, or anyone who is not able to remove the mask without help. Cover your coughs and sneezes Cover your mouth and nose with a tissue when you cough or sneeze. Throw away used tissues in a lined trash can. Immediately wash your hands with soap and water for at least 20 seconds. If soap and water are not available, clean your hands with an alcohol-based hand sanitizer that contains at least 60% alcohol. Clean your hands often Wash your hands often with soap and water for at least 20 seconds. This is especially important after blowing your nose, coughing, or sneezing; going to the bathroom; and before eating or preparing food. Use hand sanitizer if soap and water are not available. Use an alcohol-based hand sanitizer with at least 60% alcohol, covering all surfaces of your hands and rubbing them together until they feel dry. Soap and water are the best option, especially if hands are visibly dirty. Avoid touching your eyes, nose, and mouth with unwashed hands. Handwashing Tips Avoid sharing personal household items Do not share dishes, drinking glasses,  cups, eating utensils, towels, or bedding with other people in your home. Wash these items thoroughly after using them with soap and water or put in the dishwasher. Clean surfaces in your home regularly Clean and disinfect high-touch surfaces (for example, doorknobs, tables, handles, light switches, and countertops) in your "sick room" and bathroom. In shared spaces, you should clean and disinfect surfaces and items after each use by the person who is ill. If you are sick and cannot clean, a caregiver or other person should only clean and disinfect the area around you (such as your bedroom and bathroom) on an as needed basis. Your caregiver/other person should wait as long as possible (at least several hours) and wear  a mask before entering, cleaning, and disinfecting shared spaces that you use. Clean and disinfect areas that may have blood, stool, or body fluids on them. Use household cleaners and disinfectants. Clean visible dirty surfaces with household cleaners containing soap or detergent. Then, use a household disinfectant. Use a product from [employer] List N: Disinfectants for Coronavirus (COVID-19). Be sure to follow the instructions on the label to ensure safe and effective use of the product. Many products recommend keeping the surface wet with a disinfectant for a certain period of time (look at "contact time" on the product label). You may also need to wear personal protective equipment, such as gloves, depending on the directions on the product label. Immediately after disinfecting, wash your hands with soap and water for 20 seconds. For completed guidance on cleaning and disinfecting your home, visit Complete Disinfection Guidance. Take steps to improve ventilation at home Improve ventilation (air flow) at home to help prevent from spreading COVID-19 to other people in your household. Clear out COVID-19 virus particles in the air by opening windows, using air filters, and turning on fans in  your home. Use this interactive tool to learn how to improve air flow in your home. When you can be around others after being sick with COVID-19 Deciding when you can be around others is different for different situations. Find out when you can safely end home isolation. For any additional questions about your care, contact your healthcare provider or state or local health department. 02/14/2021 Content source: [facility] for Immunization and Respiratory Diseases ([facility]), Division of Viral Diseases This information is not intended to replace advice given to you by your health care provider. Make sure you discuss any questions you have with your health care provider. Document Revised: 03/30/2021 Document Reviewed: 03/30/2021 Elsevier Patient Education  2022 [employer].   If you have been instructed to have an in-person evaluation today at a local Urgent Care facility, please use the link below. It will take you to a list of all of our available [facility], including address, phone number and hours of operation. Please do not delay care.  [facility]  If you or a family member do not have a primary care provider, use the link below to schedule a visit and establish care. When you choose a [facility] primary care physician or advanced practice provider, you gain a long-term partner in health. Find a Primary Care Provider  Learn more about [facility]'s in-office and virtual care options: [facility] - Get Care Now

## 2023-08-14 NOTE — Progress Notes (Signed)
Virtual Visit Consent   Barry Tanner, you are scheduled for a virtual visit with a [facility] provider today. Just as with appointments in the office, your consent must be obtained to participate. Your consent will be active for this visit and any virtual visit you may have with one of our providers in the next 365 days. If you have a MyChart account, a copy of this consent can be sent to you electronically.  As this is a virtual visit, video technology does not allow for your provider to perform a traditional examination. This may limit your provider's ability to fully assess your condition. If your provider identifies any concerns that need to be evaluated in person or the need to arrange testing (such as labs, EKG, etc.), we will make arrangements to do so. Although advances in technology are sophisticated, we cannot ensure that it will always work on either your end or our end. If the connection with a video visit is poor, the visit may have to be switched to a telephone visit. With either a video or telephone visit, we are not always able to ensure that we have a secure connection.  By engaging in this virtual visit, you consent to the provision of healthcare and authorize for your insurance to be billed (if applicable) for the services provided during this visit. Depending on your insurance coverage, you may receive a charge related to this service.  I need to obtain your verbal consent now. Are you willing to proceed with your visit today? Barry Tanner has provided verbal consent on 08/14/2023 for a virtual visit (video or telephone). Barry Tanner, [place]  Date: 08/14/2023 12:14 PM  Virtual Visit via Video Note   I, Barry Tanner, connected with  Barry Tanner  ([id number], 1956/03/25) on 08/14/23 at 12:15 PM EDT by a video-enabled telemedicine application and verified that I am speaking with the correct person using two identifiers.  Location: Patient: Virtual Visit  Location Patient: Home Provider: Virtual Visit Location Provider: Home Office   I discussed the limitations of evaluation and management by telemedicine and the availability of in person appointments. The patient expressed understanding and agreed to proceed.    History of Present Illness: Barry Tanner is a 67 y.o. who identifies as a male who was assigned male at birth, and is being seen today for COVID-19. Patient endorses symptoms starting 1.5 days ago with cold symptoms including nasal/head congestion, cough. Friend recently positive for COVID so he tested this morning and was positive. Denies chest pain or SOB, fever, chills, nausea or vomiting.   OTC - Advil   HPI: HPI  Problems:  Patient Active Problem List   Diagnosis Date Noted   Backache 08/27/2022   Unilateral recurrent inguinal hernia without obstruction or gangrene 08/22/2022   Chewing tobacco nicotine dependence without complication 01/24/2022   Presbycusis, bilateral 05/04/2020   Amblyopia, right eye 08/07/2019   Mixed hyperlipidemia 07/15/2019   Benign essential HTN 07/13/2019   Basal cell carcinoma (BCC) of back 07/13/2019   Bradycardia 06/02/2015   Male erectile disorder 05/03/2014   Mixed conductive and sensorineural hearing loss 12/10/2012   Bilateral sensorineural hearing loss 12/10/2012   History of malignant neoplasm of skin 02/19/2011    Allergies:  Allergies  Allergen Reactions   Lisinopril Swelling   Medications:  Current Outpatient Medications:    benzonatate (TESSALON) 100 MG capsule, Take 1 capsule (100 mg total) by mouth 3 (three) times daily as needed for cough.,  Disp: 30 capsule, Rfl: 0   nirmatrelvir/ritonavir (PAXLOVID) 20 x 150 MG & 10 x 100MG  TABS, Take 3 tablets by mouth 2 (two) times daily for 5 days. (Take nirmatrelvir 150 mg two tablets twice daily for 5 days and ritonavir 100 mg one tablet twice daily for 5 days) Patient GFR is 69.21, Disp: 30 tablet, Rfl: 0   amLODipine (NORVASC) 5  MG tablet, Take 1 tablet (5 mg total) by mouth daily., Disp: 90 tablet, Rfl: 3   Ascorbic Acid (VITAMIN C) 1000 MG tablet, Take 1,000 mg by mouth daily., Disp: , Rfl:    diphenhydrAMINE (BENADRYL) 25 MG tablet, Take 1 tablet (25 mg total) by mouth every 6 (six) hours as needed., Disp: 30 tablet, Rfl: 0   GLUCOSAMINE-CHONDROIT-VIT C-MN PO, 1 PO QD ORAL, Disp: , Rfl:    hydrochlorothiazide (MICROZIDE) 12.5 MG capsule, Take one tablet by mouth daily, Disp: 90 capsule, Rfl: 3   Multiple Vitamin (MULTIVITAMIN ADULT PO), Take by mouth daily., Disp: , Rfl:    predniSONE (DELTASONE) 50 MG tablet, Take 1 tablet (50 mg total) by mouth daily., Disp: 5 tablet, Rfl: 0   predniSONE (DELTASONE) 50 MG tablet, Take one tablet po qd x 5 days with food., Disp: 5 tablet, Rfl: 0   rosuvastatin (CRESTOR) 5 MG tablet, TAKE 1 TABLET(5 MG) BY MOUTH DAILY, Disp: 90 tablet, Rfl: 3   UNABLE TO FIND, Balance of nature  Fruits and vegetables., Disp: , Rfl:   Observations/Objective: Patient is well-developed, well-nourished in no acute distress.  Resting comfortably  at home.  Head is normocephalic, atraumatic.  No labored breathing.  Speech is clear and coherent with logical content.  Patient is alert and oriented at baseline.   Assessment and Plan: 1. COVID-19 - nirmatrelvir/ritonavir (PAXLOVID) 20 x 150 MG & 10 x 100MG  TABS; Take 3 tablets by mouth 2 (two) times daily for 5 days. (Take nirmatrelvir 150 mg two tablets twice daily for 5 days and ritonavir 100 mg one tablet twice daily for 5 days) Patient GFR is 69.21  Dispense: 30 tablet; Refill: 0 - benzonatate (TESSALON) 100 MG capsule; Take 1 capsule (100 mg total) by mouth 3 (three) times daily as needed for cough.  Dispense: 30 capsule; Refill: 0  Patient with multiple risk factors for complicated course of illness. Discussed risks/benefits of antiviral medications including most common potential ADRs. Patient voiced understanding and would like to proceed with  antiviral medication. They are candidate for Paxlovid -- will hold Crestor. No longer taking Sildenafil so no worries with this. Rx sent to pharmacy. Supportive measures, OTC medications and vitamin regimen reviewed. Quarantine reviewed in detail. Strict ER precautions discussed with patient.    Follow Up Instructions: I discussed the assessment and treatment plan with the patient. The patient was provided an opportunity to ask questions and all were answered. The patient agreed with the plan and demonstrated an understanding of the instructions.  A copy of instructions were sent to the patient via MyChart unless otherwise noted below.   The patient was advised to call back or seek an in-person evaluation if the symptoms worsen or if the condition fails to improve as anticipated.  Time:  I spent 10 minutes with the patient via telehealth technology discussing the above problems/concerns.    Barry Climes, PA-C

## 2023-08-20 ENCOUNTER — Other Ambulatory Visit: Payer: Self-pay | Admitting: Physician Assistant

## 2023-11-22 ENCOUNTER — Other Ambulatory Visit: Payer: Self-pay | Admitting: Physician Assistant

## 2024-01-06 ENCOUNTER — Ambulatory Visit (INDEPENDENT_AMBULATORY_CARE_PROVIDER_SITE_OTHER): Payer: Medicare Other

## 2024-01-06 VITALS — BP 114/72 | HR 77 | Temp 98.8°F | Wt 180.8 lb

## 2024-01-06 DIAGNOSIS — Z0000 Encounter for general adult medical examination without abnormal findings: Secondary | ICD-10-CM

## 2024-01-06 NOTE — Patient Instructions (Signed)
 Mr. Lebel , Thank you for taking time to come for your Medicare Wellness Visit. I appreciate your ongoing commitment to your health goals. Please review the following plan we discussed and let me know if I can assist you in the future.   Referrals/Orders/Follow-Ups/Clinician Recommendations: maintain health and activity   This is a list of the screening recommended for you and due dates:  Health Maintenance  Topic Date Due   Zoster (Shingles) Vaccine (2 of 2) 08/23/2015   Pneumonia Vaccine (2 of 2 - PCV) 07/20/2021   Flu Shot  06/27/2023   Medicare Annual Wellness Visit  01/01/2024   DTaP/Tdap/Td vaccine (5 - Td or Tdap) 05/18/2026   Colon Cancer Screening  07/25/2028   Hepatitis C Screening  Completed   HPV Vaccine  Aged Out   COVID-19 Vaccine  Discontinued    Advanced directives: (Copy Requested) Please bring a copy of your health care power of attorney and living will to the office to be added to your chart at your convenience.  Next Medicare Annual Wellness Visit scheduled for next year: Yes

## 2024-01-06 NOTE — Progress Notes (Signed)
 Subjective:   Barry Tanner is a 68 y.o. male who presents for Medicare Annual/Subsequent preventive examination.  Visit Complete: In person   Cardiac Risk Factors include: advanced age (>24men, >28 women);male gender;hypertension;smoking/ tobacco exposure;dyslipidemia (chew)     Objective:    Today's Vitals   01/06/24 0814 01/06/24 0820  BP: 114/72   Pulse: 77   Temp: 98.8 F (37.1 C)   SpO2: 93%   Weight: 180 lb 12.8 oz (82 kg)   PainSc:  7    Body mass index is 24.52 kg/m.     01/06/2024    8:27 AM 01/11/2023    8:38 PM 12/31/2022    2:42 PM  Advanced Directives  Does Patient Have a Medical Advance Directive? Yes Yes Yes  Type of Estate agent of [place];Living will Healthcare Power of [place];Living will Healthcare Power of [place];Living will  Copy of Healthcare Power of Attorney in Chart? No - copy requested  No - copy requested    Current Medications (verified) Outpatient Encounter Medications as of 01/06/2024  Medication Sig   Ascorbic Acid (VITAMIN C) 1000 MG tablet Take 1,000 mg by mouth daily.   diphenhydrAMINE  (BENADRYL ) 25 MG tablet Take 1 tablet (25 mg total) by mouth every 6 (six) hours as needed.   GLUCOSAMINE-CHONDROIT-VIT C-MN PO 1 PO QD ORAL   hydrochlorothiazide  (MICROZIDE ) 12.5 MG capsule TAKE 1 CAPSULE BY MOUTH DAILY   lisinopril  (ZESTRIL ) 20 MG tablet Take 20 mg by mouth daily.   Multiple Vitamin (MULTIVITAMIN ADULT PO) Take by mouth daily.   UNABLE TO FIND Balance of nature  Fruits and vegetables.   [DISCONTINUED] amLODipine  (NORVASC ) 5 MG tablet Take 1 tablet (5 mg total) by mouth daily.   [DISCONTINUED] benzonatate  (TESSALON ) 100 MG capsule Take 1 capsule (100 mg total) by mouth 3 (three) times daily as needed for cough.   [DISCONTINUED] rosuvastatin  (CRESTOR ) 5 MG tablet TAKE 1 TABLET(5 MG) BY MOUTH DAILY   No facility-administered encounter medications on file as of 01/06/2024.    Allergies  (verified) Lisinopril    History: Past Medical History:  Diagnosis Date   Benign essential HTN 07/13/2019   Mixed hyperlipidemia 07/15/2019   Past Surgical History:  Procedure Laterality Date   CHOLECYSTECTOMY  1998   HERNIA REPAIR  2008   L 1998 R 2004   INCISIONAL HERNIA REPAIR Left 08/02/2022   Family History  Problem Relation Age of Onset   Stroke Mother    Hypertension Mother    Diabetes Mother    Hearing loss Father    Social History   Socioeconomic History   Marital status: Divorced    Spouse name: Not on file   Number of children: 3   Years of education: Not on file   Highest education level: Not on file  Occupational History   Not on file  Tobacco Use   Smoking status: Former   Smokeless tobacco: Current    Types: Chew  Vaping Use   Vaping status: Never Used  Substance and Sexual Activity   Alcohol use: Yes    Alcohol/week: 10.0 standard drinks of alcohol    Types: 10 Shots of liquor per week   Drug use: Never   Sexual activity: Not Currently    Birth control/protection: Condom  Other Topics Concern   Not on file  Social History Narrative   Company secretary 59yrs ground xportation   Social Drivers of Health   Financial Resource Strain: Low Risk  (01/06/2024)   Overall Financial  Resource Strain (CARDIA)    Difficulty of Paying Living Expenses: Not hard at all  Food Insecurity: No Food Insecurity (01/06/2024)   Hunger Vital Sign    Worried About Running Out of Food in the Last Year: Never true    Ran Out of Food in the Last Year: Never true  Transportation Needs: No Transportation Needs (01/06/2024)   PRAPARE - Administrator, Civil Service (Medical): No    Lack of Transportation (Non-Medical): No  Physical Activity: Sufficiently Active (01/06/2024)   Exercise Vital Sign    Days of Exercise per Week: 7 days    Minutes of Exercise per Session: 40 min  Stress: No Stress Concern Present (01/06/2024)   [employer] of Occupational Health -  Occupational Stress Questionnaire    Feeling of Stress : Not at all  Social Connections: Socially Isolated (01/06/2024)   Social Connection and Isolation Panel [NHANES]    Frequency of Communication with Friends and Family: More than three times a week    Frequency of Social Gatherings with Friends and Family: More than three times a week    Attends Religious Services: Never    Database administrator or Organizations: No    Attends Engineer, structural: Never    Marital Status: Divorced    Tobacco Counseling Ready to quit: Not Answered Counseling given: Not Answered   Clinical Intake:  Pre-visit preparation completed: Yes  Pain : 0-10 Pain Score: 7  Pain Type: Chronic pain Pain Location: Back Pain Orientation: Lower Pain Descriptors / Indicators: Aching, Dull Pain Frequency: Intermittent Pain Relieving Factors: CHIROPRACTOR  Pain Relieving Factors: CHIROPRACTOR  BMI - recorded: 24.52 Nutritional Status: BMI of 19-24  Normal Nutritional Risks: None Diabetes: No  How often do you need to have someone help you when you read instructions, pamphlets, or other written materials from your doctor or pharmacy?: 1 - Never  Interpreter Needed?: No  Information entered by :: Lamont Pilsner, LPN   Activities of Daily Living    01/06/2024    8:22 AM  In your present state of health, do you have any difficulty performing the following activities:  Hearing? 1  Comment left ear loss of hearing  Vision? 0  Difficulty concentrating or making decisions? 0  Walking or climbing stairs? 0  Dressing or bathing? 0  Doing errands, shopping? 0  Preparing Food and eating ? N  Using the Toilet? N  In the past six months, have you accidently leaked urine? N  Do you have problems with loss of bowel control? N  Managing your Medications? N  Managing your Finances? N  Housekeeping or managing your Housekeeping? N    Patient Care Team: Allwardt, Alyssa M, PA-C as PCP - General  (Physician Assistant)  Indicate any recent Medical Services you may have received from other than [facility] providers in the past year (date may be approximate).     Assessment:   This is a routine wellness examination for Rodnie.  Hearing/Vision screen Hearing Screening - Comments:: Left ear loss of hearing [facility]  Vision Screening - Comments:: Pt will follow up with new eye provider was going to my eye dr for last Annual eye exams    Goals Addressed             This Visit's Progress    Patient Stated       Maintain health and activity       Depression Screen  01/06/2024    8:27 AM 05/08/2023   10:53 AM 01/22/2023    7:43 AM 12/31/2022    2:39 PM 08/22/2022    8:25 AM 01/24/2022    8:03 AM 07/20/2020    8:21 AM  PHQ 2/9 Scores  PHQ - 2 Score 0 0 0 0 0 0 0  PHQ- 9 Score  0 1        Fall Risk    01/06/2024    8:29 AM 05/08/2023   10:53 AM 01/22/2023    7:43 AM 12/31/2022    2:43 PM 08/22/2022    8:25 AM  Fall Risk   Falls in the past year? 0 0 0 0 0  Number falls in past yr: 0 0 0 0 0  Injury with Fall? 0 0 0 0 0  Risk for fall due to : No Fall Risks No Fall Risks No Fall Risks Impaired vision   Follow up Falls prevention discussed;Falls evaluation completed Falls evaluation completed Falls evaluation completed Falls prevention discussed Falls prevention discussed    MEDICARE RISK AT HOME: Medicare Risk at Home Any stairs in or around the home?: Yes If so, are there any without handrails?: No Home free of loose throw rugs in walkways, pet beds, electrical cords, etc?: Yes Adequate lighting in your home to reduce risk of falls?: Yes Life alert?: No Use of a cane, walker or w/c?: No Grab bars in the bathroom?: No Shower chair or bench in shower?: No Elevated toilet seat or a handicapped toilet?: No  TIMED UP AND GO:  Was the test performed?  Yes  Length of time to ambulate 10 feet: 10 sec Gait steady and fast without use of assistive  device    Cognitive Function:        01/06/2024    8:31 AM 12/31/2022    2:45 PM  6CIT Screen  What Year? 0 points 0 points  What month? 0 points 0 points  What time? 0 points 0 points  Count back from 20 0 points 0 points  Months in reverse 0 points 0 points  Repeat phrase 6 points 2 points  Total Score 6 points 2 points    Immunizations Immunization History  Administered Date(s) Administered   Fluad Quad(high Dose 65+) 10/03/2021, 08/22/2022   Hep A / Hep B 09/15/2001, 10/21/2001, 09/21/2004   Hepatitis A, Adult 10/16/1994, 09/15/2001, 10/21/2001   IPV 11/05/1975, 09/06/2008   Influenza Split 05/18/2016   MenQuadfi_Meningococcal Groups ACYW Conjugate 04/16/1994   PFIZER(Purple Top)SARS-COV-2 Vaccination 02/19/2020, 03/11/2020   Pneumococcal Polysaccharide-23 07/20/2020   Rubella 09/06/2008   Td 06/28/1988, 09/15/2001   Tdap 11/05/2012, 05/18/2016   Typhoid Parenteral 09/21/2004   Yellow Fever 10/13/1993   Zoster Recombinant(Shingrix) 06/28/2015   Zoster, Live 06/27/2015    TDAP status: Up to date  Flu Vaccine status: Declined, Education has been provided regarding the importance of this vaccine but patient still declined. Advised may receive this vaccine at local pharmacy or Health Dept. Aware to provide a copy of the vaccination record if obtained from local pharmacy or Health Dept. Verbalized acceptance and understanding.  Pneumococcal vaccine status: Declined,  Education has been provided regarding the importance of this vaccine but patient still declined. Advised may receive this vaccine at local pharmacy or Health Dept. Aware to provide a copy of the vaccination record if obtained from local pharmacy or Health Dept. Verbalized acceptance and understanding.   Covid-19 vaccine status: Declined, Education has been provided regarding the importance of this vaccine  but patient still declined. Advised may receive this vaccine at local pharmacy or Health Dept.or vaccine  clinic. Aware to provide a copy of the vaccination record if obtained from local pharmacy or Health Dept. Verbalized acceptance and understanding.  Qualifies for Shingles Vaccine? Yes   Zostavax completed Yes   Shingrix Completed?: No.    Education has been provided regarding the importance of this vaccine. Patient has been advised to call insurance company to determine out of pocket expense if they have not yet received this vaccine. Advised may also receive vaccine at local pharmacy or Health Dept. Verbalized acceptance and understanding.  Screening Tests Health Maintenance  Topic Date Due   Zoster Vaccines- Shingrix (2 of 2) 08/23/2015   Pneumonia Vaccine 46+ Years old (2 of 2 - PCV) 07/20/2021   INFLUENZA VACCINE  02/24/2024 (Originally 06/27/2023)   Medicare Annual Wellness (AWV)  01/05/2025   DTaP/Tdap/Td (5 - Td or Tdap) 05/18/2026   Colonoscopy  07/25/2028   Hepatitis C Screening  Completed   HPV VACCINES  Aged Out   COVID-19 Vaccine  Discontinued    Health Maintenance  Health Maintenance Due  Topic Date Due   Zoster Vaccines- Shingrix (2 of 2) 08/23/2015   Pneumonia Vaccine 57+ Years old (2 of 2 - PCV) 07/20/2021    Colorectal cancer screening: Type of screening: Colonoscopy. Completed 07/25/18. Repeat every 10 years  Additional Screening:  Hepatitis C Screening: Completed /819/20  Vision Screening: Recommended annual ophthalmology exams for early detection of glaucoma and other disorders of the eye. Is the patient up to date with their annual eye exam?  Yes  Who is the provider or what is the name of the office in which the patient attends annual eye exams? Will follow up with new provider  If pt is not established with a provider, would they like to be referred to a provider to establish care? No .   Dental Screening: Recommended annual dental exams for proper oral hygiene  Community Resource Referral / Chronic Care Management: CRR required this visit?  No   CCM  required this visit?  No     Plan:     I have personally reviewed and noted the following in the patient's chart:   Medical and social history Use of alcohol, tobacco or illicit drugs  Current medications and supplements including opioid prescriptions. Patient is not currently taking opioid prescriptions. Functional ability and status Nutritional status Physical activity Advanced directives List of other physicians Hospitalizations, surgeries, and ER visits in previous 12 months Vitals Screenings to include cognitive, depression, and falls Referrals and appointments  In addition, I have reviewed and discussed with patient certain preventive protocols, quality metrics, and best practice recommendations. A written personalized care plan for preventive services as well as general preventive health recommendations were provided to patient.     Bruno Capri, LPN   1/61/0960   After Visit Summary: (MyChart) Due to this being a telephonic visit, the after visit summary with patients personalized plan was offered to patient via MyChart   Nurse Notes: none

## 2024-04-21 ENCOUNTER — Telehealth: Payer: Self-pay | Admitting: Physician Assistant

## 2024-04-21 NOTE — Telephone Encounter (Unsigned)
 Copied from CRM [id number]. Topic: Clinical - Medication Refill >> Apr 21, 2024 11:02 AM Barry Tanner wrote: Medication: Amlodipine  Besylate 5 mg  Has the patient contacted their pharmacy? No (Agent: If no, request that the patient contact the pharmacy for the refill. If patient does not wish to contact the pharmacy document the reason why and proceed with request.) (Agent: If yes, when and what did the pharmacy advise?)  This is the patient's preferred pharmacy:  [id number] Barry Tanner, [place] - [address] AT [facility] [address] [facility] [address] [place] [address] Phone: [phone] Fax: [phone]  Is this the correct pharmacy for this prescription? Yes If no, delete pharmacy and type the correct one.   Has the prescription been filled recently? No  Is the patient out of the medication? Yes  Has the patient been seen for an appointment in the last year OR does the patient have an upcoming appointment? Yes  Can we respond through MyChart? Yes  Agent: Please be advised that Rx refills may take up to 3 business days. We ask that you follow-up with your pharmacy.

## 2024-04-22 NOTE — Telephone Encounter (Signed)
 Patient calling back in due to he was left a voicemail by [employer]. He called to confirm he takes amlodipine  5mg  daily, he states he only has 1-2 days left. He states if his PCP doesn't think he needs to take it anymore he is fine with that or if it doesn't need to be refilled till his follow up appt in June.

## 2024-04-22 NOTE — Telephone Encounter (Signed)
 Patient has prescription for Amlodipine , however not seen in over 1 year and Rx is expired. Please advise on refill, pt scheduled for 05/10/24

## 2024-04-23 ENCOUNTER — Other Ambulatory Visit: Payer: Self-pay

## 2024-04-23 MED ORDER — AMLODIPINE BESYLATE 5 MG PO TABS: 5.0000 mg | ORAL_TABLET | Freq: Every day | ORAL | 0 refills | Status: DC

## 2024-04-23 NOTE — Telephone Encounter (Signed)
 Called pt and advised 30 day supply sent to pharmacy on file; advised office cb number if needed and also option to msg via MyChart for assistance.

## 2024-05-11 ENCOUNTER — Encounter: Payer: Self-pay | Admitting: Physician Assistant

## 2024-05-11 ENCOUNTER — Ambulatory Visit (INDEPENDENT_AMBULATORY_CARE_PROVIDER_SITE_OTHER): Payer: Medicare Other | Admitting: Physician Assistant

## 2024-05-11 ENCOUNTER — Ambulatory Visit: Payer: Self-pay | Admitting: Physician Assistant

## 2024-05-11 ENCOUNTER — Ambulatory Visit: Payer: Medicare Other | Admitting: Physician Assistant

## 2024-05-11 VITALS — BP 130/80 | HR 60 | Temp 98.2°F | Ht 72.0 in | Wt 175.0 lb

## 2024-05-11 DIAGNOSIS — Z131 Encounter for screening for diabetes mellitus: Secondary | ICD-10-CM | POA: Diagnosis not present

## 2024-05-11 DIAGNOSIS — E782 Mixed hyperlipidemia: Secondary | ICD-10-CM

## 2024-05-11 DIAGNOSIS — I10 Essential (primary) hypertension: Secondary | ICD-10-CM | POA: Diagnosis not present

## 2024-05-11 LAB — COMPREHENSIVE METABOLIC PANEL WITH GFR
ALT: 17 U/L (ref 0–53)
AST: 18 U/L (ref 0–37)
Albumin: 4.5 g/dL (ref 3.5–5.2)
Alkaline Phosphatase: 84 U/L (ref 39–117)
BUN: 11 mg/dL (ref 6–23)
CO2: 30 meq/L (ref 19–32)
Calcium: 9.4 mg/dL (ref 8.4–10.5)
Chloride: 104 meq/L (ref 96–112)
Creatinine, Ser: 1.1 mg/dL (ref 0.40–1.50)
GFR: 68.72 mL/min (ref >60.00)
Glucose, Bld: 94 mg/dL (ref 70–99)
Potassium: 4 meq/L (ref 3.5–5.1)
Sodium: 140 meq/L (ref 135–145)
Total Bilirubin: 0.7 mg/dL (ref 0.2–1.2)
Total Protein: 7.1 g/dL (ref 6.0–8.3)

## 2024-05-11 LAB — LIPID PANEL
Cholesterol: 160 mg/dL (ref 0–200)
HDL: 46 mg/dL (ref >39.00)
LDL Cholesterol: 98 mg/dL (ref 0–99)
NonHDL: 113.57
Total CHOL/HDL Ratio: 3
Triglycerides: 78 mg/dL (ref 0.0–149.0)
VLDL: 15.6 mg/dL (ref 0.0–40.0)

## 2024-05-11 LAB — HEMOGLOBIN A1C: Hgb A1c MFr Bld: 5.4 % (ref 4.6–6.5)

## 2024-05-11 MED ORDER — AMLODIPINE BESYLATE 5 MG PO TABS: 5.0000 mg | ORAL_TABLET | Freq: Every day | ORAL | 3 refills | Status: DC

## 2024-05-11 MED ORDER — HYDROCHLOROTHIAZIDE 12.5 MG PO CAPS: ORAL_CAPSULE | ORAL | 3 refills | Status: AC

## 2024-05-11 NOTE — Progress Notes (Signed)
 Patient ID: Barry Tanner, male    DOB: 03-25-1956, 68 y.o.   MRN: [id number]   Assessment & Plan:  Benign essential HTN -     hydroCHLOROthiazide ; TAKE 1 CAPSULE BY MOUTH DAILY  Dispense: 90 capsule; Refill: 3 -     amLODIPine  Besylate; Take 1 tablet (5 mg total) by mouth daily.  Dispense: 90 tablet; Refill: 3 -     Comprehensive metabolic panel with GFR -     Lipid panel -     Hemoglobin A1c  Mixed hyperlipidemia -     Lipid panel  Diabetes mellitus screening -     Comprehensive metabolic panel with GFR -     Hemoglobin A1c      Assessment and Plan Assessment & Plan Hypertension Hypertension is managed with amlodipine  5 mg and hydrochlorothiazide  12.5 mg. Blood pressure fluctuates slightly, especially post-physical activity, and may be elevated due to missed medication before the visit. Target blood pressure is 130/80 mmHg or lower. - Prescribe amlodipine  5 mg and hydrochlorothiazide  12.5 mg with a 90-day supply and refills. - Encourage regular home blood pressure monitoring and reporting of results. - Check blood pressure before leaving the clinic.  Diverticulosis Diverticulosis identified on a CT scan last year with no current symptoms or complications.  General Health Maintenance He is in good health with no significant changes in vision or hearing. Notable weight loss of 5 pounds. Engages in physical activity using a stationary bike. No prostate symptoms and no family history of prostate cancer. Scheduled for a dermatology appointment. PSA testing is not pursued due to lack of symptoms, family history, and insurance coverage. - Order blood work to screen for diabetes. - Advise against PSA testing due to cost and lack of symptoms or family history of prostate cancer. - Encourage continued physical activity and healthy eating habits. - Advise regular vision checks every couple of years.      Return in about 1 year (around 05/11/2025) for recheck/follow-up, blood  pressure check.    Subjective:    Chief Complaint  Patient presents with   Follow-up    Follow up on BP; doing well; just didn't take BP meds this morning    HPI Discussed the use of AI scribe software for clinical note transcription with the patient, who gave verbal consent to proceed.  History of Present Illness Barry Tanner is a 68 year old male who presents for a routine follow-up visit.  He has not experienced significant changes in vision or hearing over the past year. His glasses are two years old, and he typically checks his vision every couple of years. No changes in vision or hearing, chest pain, shortness of breath, or swelling.  He has lost four to five pounds since his last visit, attributing this to improved eating habits. He continues to drink rum in the evenings, though not every night, and does not drink to excess due to a history of an ulcer.  He recently stopped using a rowing machine due to back discomfort and switched to a stationary bike. His knees do not bother him except when using the bike with tension, which he is adjusting to.  He has a history of diverticulosis identified on a CT scan last year. He experiences intermittent hip pain that comes and goes but does not persist.  No prostate symptoms such as nocturia, weak stream, or pelvic pain. No family history of prostate cancer.  He occasionally monitors his blood pressure at home,  noting fluctuations, particularly after activity. He did not bring his blood pressure log to the visit. His current medications include amlodipine  5 mg and hydrochlorothiazide  12.5 mg, obtained from [facility].     Past Medical History:  Diagnosis Date   Benign essential HTN 07/13/2019   Mixed hyperlipidemia 07/15/2019    Past Surgical History:  Procedure Laterality Date   CHOLECYSTECTOMY  1998   HERNIA REPAIR  2008   L 1998 R 2004   INCISIONAL HERNIA REPAIR Left 08/02/2022    Family History  Problem  Relation Age of Onset   Stroke Mother    Hypertension Mother    Diabetes Mother    Hearing loss Father     Social History   Tobacco Use   Smoking status: Former   Smokeless tobacco: Current    Types: Chew  Vaping Use   Vaping status: Never Used  Substance Use Topics   Alcohol use: Yes    Alcohol/week: 10.0 standard drinks of alcohol    Types: 10 Shots of liquor per week   Drug use: Never     Allergies  Allergen Reactions   Lisinopril  Swelling    Review of Systems NEGATIVE UNLESS OTHERWISE INDICATED IN HPI      Objective:     BP 130/80 (BP Location: Left Arm)   Pulse 60   Temp 98.2 F (36.8 C)   Ht 6' (1.829 m)   Wt 175 lb (79.4 kg)   SpO2 96%   BMI 23.73 kg/m   Wt Readings from Last 3 Encounters:  05/11/24 175 lb (79.4 kg)  01/06/24 180 lb 12.8 oz (82 kg)  05/08/23 172 lb 12.8 oz (78.4 kg)    BP Readings from Last 3 Encounters:  05/11/24 130/80  01/06/24 114/72  05/08/23 138/80     Physical Exam Vitals and nursing note reviewed.  Constitutional:      General: He is not in acute distress.    Appearance: Normal appearance. He is not toxic-appearing.  HENT:     Head: Normocephalic and atraumatic.     Right Ear: Tympanic membrane, ear canal and external ear normal.     Left Ear: Tympanic membrane, ear canal and external ear normal.     Nose: Nose normal.     Mouth/Throat:     Mouth: Mucous membranes are moist.     Pharynx: Oropharynx is clear.   Eyes:     Extraocular Movements: Extraocular movements intact.     Conjunctiva/sclera: Conjunctivae normal.     Pupils: Pupils are equal, round, and reactive to light.    Cardiovascular:     Rate and Rhythm: Normal rate and regular rhythm.     Pulses: Normal pulses.     Heart sounds: Normal heart sounds.  Pulmonary:     Effort: Pulmonary effort is normal.     Breath sounds: Normal breath sounds.  Abdominal:     General: Abdomen is flat. Bowel sounds are normal.     Palpations: Abdomen is soft.      Tenderness: There is no abdominal tenderness.   Musculoskeletal:        General: Normal range of motion.     Cervical back: Normal range of motion and neck supple.     Right lower leg: No edema.     Left lower leg: No edema.   Skin:    General: Skin is warm and dry.     Findings: No lesion or rash.   Neurological:     General: No  focal deficit present.     Mental Status: He is alert and oriented to person, place, and time.   Psychiatric:        Mood and Affect: Mood normal.        Behavior: Behavior normal.             Kristie Bracewell M Westyn Keatley, PA-C

## 2024-11-13 ENCOUNTER — Emergency Department (HOSPITAL_BASED_OUTPATIENT_CLINIC_OR_DEPARTMENT_OTHER)
Admission: EM | Admit: 2024-11-13 | Discharge: 2024-11-13 | Disposition: A | Discharge status: Home / Self Care | Attending: Emergency Medicine | Admitting: Emergency Medicine

## 2024-11-13 ENCOUNTER — Encounter (HOSPITAL_BASED_OUTPATIENT_CLINIC_OR_DEPARTMENT_OTHER): Payer: Self-pay | Admitting: Emergency Medicine

## 2024-11-13 ENCOUNTER — Emergency Department (HOSPITAL_BASED_OUTPATIENT_CLINIC_OR_DEPARTMENT_OTHER): Admitting: Radiology

## 2024-11-13 ENCOUNTER — Other Ambulatory Visit: Payer: Self-pay

## 2024-11-13 DIAGNOSIS — I10 Essential (primary) hypertension: Secondary | ICD-10-CM | POA: Insufficient documentation

## 2024-11-13 DIAGNOSIS — Z79899 Other long term (current) drug therapy: Secondary | ICD-10-CM | POA: Diagnosis not present

## 2024-11-13 DIAGNOSIS — R059 Cough, unspecified: Secondary | ICD-10-CM | POA: Diagnosis present

## 2024-11-13 DIAGNOSIS — J069 Acute upper respiratory infection, unspecified: Secondary | ICD-10-CM | POA: Insufficient documentation

## 2024-11-13 LAB — RESP PANEL BY RT-PCR (RSV, FLU A&B, COVID)  RVPGX2
Influenza A by PCR: NEGATIVE
Influenza B by PCR: NEGATIVE
Resp Syncytial Virus by PCR: NEGATIVE
SARS Coronavirus 2 by RT PCR: NEGATIVE

## 2024-11-13 NOTE — ED Triage Notes (Signed)
 C/o flu like symptoms with bloody cough x 5 days. Denies CP or SHOB.

## 2024-11-13 NOTE — ED Provider Notes (Signed)
 " [place] EMERGENCY DEPARTMENT AT [facility] Provider Note   CSN: [id number] Arrival date & time: 11/13/24  9197     Patient presents with: Cough   Barry Tanner is a 68 y.o. male.    Cough Patient with URI symptoms and cough.  Seen for around 5 days.  Has had cough with some sputum production.  No real shortness of breath.  States he feels well but today he sputum had a little bit of blood in it.  No other bleeding.  No lightheadedness or dizziness.  No easy bruising.  Not on blood thinners.    Past Medical History:  Diagnosis Date   Benign essential HTN 07/13/2019   Mixed hyperlipidemia 07/15/2019    Prior to Admission medications  Medication Sig Start Date End Date Taking? Authorizing Provider  amLODipine  (NORVASC ) 5 MG tablet Take 1 tablet (5 mg total) by mouth daily. 05/11/24   Allwardt, Alyssa M, PA-C  Ascorbic Acid (VITAMIN C) 1000 MG tablet Take 1,000 mg by mouth daily.    [provider]  diphenhydrAMINE  (BENADRYL ) 25 MG tablet Take 1 tablet (25 mg total) by mouth every 6 (six) hours as needed. 01/11/23   Randol Simmonds, MD  GLUCOSAMINE-CHONDROIT-VIT C-MN PO 1 PO QD ORAL 11/05/12   [provider]  hydrochlorothiazide  (MICROZIDE ) 12.5 MG capsule TAKE 1 CAPSULE BY MOUTH DAILY 05/11/24   Allwardt, Alyssa M, PA-C  Multiple Vitamin (MULTIVITAMIN ADULT PO) Take by mouth daily.    [provider]  UNABLE TO FIND Balance of nature  Fruits and vegetables.    [provider]    Allergies: Lisinopril     Review of Systems  Respiratory:  Positive for cough.     Updated Vital Signs BP (!) 163/90 (BP Location: Right Arm)   Pulse 85   Temp 99.4 F (37.4 C) (Oral)   Resp 16   SpO2 97%   Physical Exam Vitals and nursing note reviewed.  Cardiovascular:     Rate and Rhythm: Regular rhythm.  Pulmonary:     Breath sounds: No wheezing or rhonchi.  Abdominal:     Tenderness: There is no abdominal tenderness.  Skin:    Coloration:  Skin is not pale.     Findings: No bruising or rash.  Neurological:     Mental Status: He is alert.     (all labs ordered are listed, but only abnormal results are displayed) Labs Reviewed  RESP PANEL BY RT-PCR (RSV, FLU A&B, COVID)  [id number]    EKG: None  Radiology: DG Chest 2 View Result Date: 11/13/2024 EXAM: 2 VIEW(S) XRAY OF THE CHEST 11/13/2024 08:25:00 AM COMPARISON: None available. CLINICAL HISTORY: CP FINDINGS: LUNGS AND PLEURA: Elevated right hemidiaphragm. No focal pulmonary opacity. No pleural effusion. No pneumothorax. HEART AND MEDIASTINUM: No acute abnormality of the cardiac and mediastinal silhouettes. BONES AND SOFT TISSUES: No acute osseous abnormality. IMPRESSION: 1. No acute cardiopulmonary findings. 2. Elevated right hemidiaphragm. Electronically signed by: Evalene Coho MD 11/13/2024 09:15 AM EST RP Workstation: [id number]     Procedures   Medications Ordered in the ED - No data to display                                  Medical Decision Making Amount and/or Complexity of Data Reviewed Radiology: ordered.   Patient with URI symptoms cough.  Potentially viral but is had 5 days of symptoms so not really an  antiviral candidate.  With small amount of hemoptysis I think likely secondary to viral syndrome.  No other signs of thrombocytopenia.  Will get x-ray to evaluate for pneumonia.  X-ray negative.  Flu COVID RSV testing negative.  Doubt PE.  Doubt severe bleed at this time.  Likely from URI symptoms.  Follow-up with PCP or return for worsening symptoms.     Final diagnoses:  Upper respiratory tract infection, unspecified type    ED Discharge Orders     None          Patsey Lot, MD 11/13/24 0932  "

## 2024-11-13 NOTE — Discharge Instructions (Signed)
 Your workup was reassuring today.  Likely URI caused little bleeding in the airway.  Watch for increased breathing.  Return for worsening symptoms.

## 2024-11-16 ENCOUNTER — Telehealth: Payer: Self-pay

## 2024-11-16 NOTE — Telephone Encounter (Signed)
 Transition Care Management Follow-up Telephone Call Date of discharge and from where: 11/13/24 [facility] How have you been since you were released from the hospital? Same Any questions or concerns? No  Items Reviewed: Did the pt receive and understand the discharge instructions provided? Yes  Medications obtained and verified? Yes  Other? No  Any new allergies since your discharge? No  Dietary orders reviewed? No Do you have support at home? Yes   Home Care and Equipment/Supplies: Were home health services ordered? not applicable If so, what is the name of the agency? N'A  Has the agency set up a time to come to the patient's home? not applicable Were any new equipment or medical supplies ordered?  No What is the name of the medical supply agency? N/A Were you able to get the supplies/equipment? not applicable Do you have any questions related to the use of the equipment or supplies? No  Functional Questionnaire: (I = Independent and D = Dependent) ADLs: I  Bathing/Dressing- I  Meal Prep- I  Eating- I  Maintaining continence- I  Transferring/Ambulation- I  Managing Meds- I  Follow up appointments reviewed:  PCP Hospital f/u appt confirmed? Yes  Scheduled to see Alyssa Allwardt, PA-C on 11/24/24 @ 8:30am. Specialist Hospital f/u appt confirmed? No   Are transportation arrangements needed? No  If their condition worsens, is the pt aware to call PCP or go to the Emergency Dept.? Yes Was the patient provided with contact information for the PCP's office or ED? Yes Was to pt encouraged to call back with questions or concerns? Yes

## 2024-11-20 ENCOUNTER — Other Ambulatory Visit: Payer: Self-pay | Admitting: Physician Assistant

## 2024-11-20 DIAGNOSIS — I10 Essential (primary) hypertension: Secondary | ICD-10-CM

## 2024-11-24 ENCOUNTER — Encounter: Payer: Self-pay | Admitting: Physician Assistant

## 2024-11-24 ENCOUNTER — Ambulatory Visit (INDEPENDENT_AMBULATORY_CARE_PROVIDER_SITE_OTHER): Admitting: Physician Assistant

## 2024-11-24 VITALS — BP 132/74 | HR 70 | Temp 98.7°F | Ht 72.0 in | Wt 174.2 lb

## 2024-11-24 DIAGNOSIS — I10 Essential (primary) hypertension: Secondary | ICD-10-CM

## 2024-11-24 DIAGNOSIS — J069 Acute upper respiratory infection, unspecified: Secondary | ICD-10-CM | POA: Diagnosis not present

## 2024-11-24 DIAGNOSIS — Z23 Encounter for immunization: Secondary | ICD-10-CM

## 2024-11-24 NOTE — Progress Notes (Signed)
 "   Patient ID: Barry Tanner, male    DOB: November 26, 1956, 68 y.o.   MRN: [id number]   Assessment & Plan:  Acute URI  Benign essential HTN  Immunization due -     Flu vaccine HIGH DOSE PF(Fluzone Trivalent) -     Pneumococcal conjugate vaccine 20-valent     Assessment & Plan Acute upper respiratory infection Recent upper respiratory infection with initial hemoptysis, likely secondary to viral etiology. Negative chest x-ray for pneumonia. Negative flu, COVID, and RSV tests. Symptoms include persistent dry cough with occasional phlegm production. No fever currently. Symptoms likely exacerbated by stress and environmental factors. - Continue conservative management with increased fluid intake. - Use Mucinex as needed for mucus clearance.  Hypertension Well controlled with current medication regimen. Blood pressure readings are stable. - Continue current antihypertensive regimen: amlodipine  5 mg and hydrochlorothiazide  12.5 mg.  General Health Maintenance Received flu shot and pneumonia vaccine today. - Ensure vaccinations are up to date.   F/up prn     Subjective:    Chief Complaint  Patient presents with   Follow-up    Pt in office for ED follow up from URI; pt states no longer running fever and feeling much better;     HPI Discussed the use of AI scribe software for clinical note transcription with the patient, who gave verbal consent to proceed.  History of Present Illness Barry Tanner is a 68 year old male who presents for an ER follow-up after being diagnosed with an upper respiratory infection.  He was diagnosed with an upper respiratory infection following an ER visit where he experienced a small amount of hemoptysis. A chest x-ray was performed to evaluate for pneumonia, which returned negative. Flu, COVID, and RSV tests were also negative. He was given conservative treatment options at the ER.  Since the ER visit, he no longer has a fever. However,  he continues to experience a persistent cough, which is mostly dry but occasionally produces a small amount of phlegm. He notes that the cough seems to linger. No recent travel or exposure to sick individuals. He mentions a stressful period when his daughter visited from [place], but he did not experience fever during her stay. The fever and symptoms began two days after her departure.  He is currently taking lisinopril  and another unspecified medication for hypertension. He continues to take vitamins and is drinking plenty of fluids. He uses a small amount of alcohol occasionally, specifically rum, but not on a daily basis. He has a supply of Tylenol at home.  During the illness, he experienced hearing difficulties due to sinus congestion, describing it as 'wore out with hearing'. He has been staying at home with his dog during the illness.     Past Medical History:  Diagnosis Date   Benign essential HTN 07/13/2019   Mixed hyperlipidemia 07/15/2019    Past Surgical History:  Procedure Laterality Date   CHOLECYSTECTOMY  1998   HERNIA REPAIR  2008   L 1998 R 2004   INCISIONAL HERNIA REPAIR Left 08/02/2022    Family History  Problem Relation Age of Onset   Stroke Mother    Hypertension Mother    Diabetes Mother    Hearing loss Father     Social History[1]   Allergies[2]  Review of Systems NEGATIVE UNLESS OTHERWISE INDICATED IN HPI      Objective:     BP 132/74 (BP Location: Left Arm, Patient Position: Sitting, Cuff Size: Normal)  Pulse 70   Temp 98.7 F (37.1 C) (Temporal)   Ht 6' (1.829 m)   Wt 174 lb 3.2 oz (79 kg)   SpO2 98%   BMI 23.63 kg/m   Wt Readings from Last 3 Encounters:  11/24/24 174 lb 3.2 oz (79 kg)  05/11/24 175 lb (79.4 kg)  01/06/24 180 lb 12.8 oz (82 kg)    BP Readings from Last 3 Encounters:  11/24/24 132/74  11/13/24 124/78  05/11/24 130/80     Physical Exam Vitals and nursing note reviewed.  Constitutional:      General: He is  not in acute distress.    Appearance: Normal appearance. He is not ill-appearing.  HENT:     Head: Normocephalic.     Right Ear: Tympanic membrane, ear canal and external ear normal.     Left Ear: Tympanic membrane, ear canal and external ear normal.     Nose: Congestion (minimal) present.     Mouth/Throat:     Mouth: Mucous membranes are moist.     Pharynx: No oropharyngeal exudate or posterior oropharyngeal erythema.  Eyes:     Extraocular Movements: Extraocular movements intact.     Conjunctiva/sclera: Conjunctivae normal.     Pupils: Pupils are equal, round, and reactive to light.  Cardiovascular:     Rate and Rhythm: Normal rate and regular rhythm.     Pulses: Normal pulses.     Heart sounds: Normal heart sounds. No murmur heard. Pulmonary:     Effort: Pulmonary effort is normal. No respiratory distress.     Breath sounds: Normal breath sounds. No wheezing.  Musculoskeletal:     Cervical back: Normal range of motion.  Skin:    General: Skin is warm.  Neurological:     Mental Status: He is alert and oriented to person, place, and time.  Psychiatric:        Mood and Affect: Mood normal.        Behavior: Behavior normal.             Nilda Keathley M Liat Mayol, PA-C    [1]  Social History Tobacco Use   Smoking status: Former   Smokeless tobacco: Current    Types: Chew  Vaping Use   Vaping status: Never Used  Substance Use Topics   Alcohol use: Yes    Alcohol/week: 10.0 standard drinks of alcohol    Types: 10 Shots of liquor per week   Drug use: Never  [2]  Allergies Allergen Reactions   Lisinopril  Swelling   "

## 2025-01-07 ENCOUNTER — Ambulatory Visit: Payer: Medicare Other

## 2025-05-13 ENCOUNTER — Ambulatory Visit: Admitting: Physician Assistant
